# Patient Record
Sex: Male | Born: 1995 | Race: Black or African American | Hispanic: No | Marital: Single | Smoking: Current every day smoker
Health system: Southern US, Community
[De-identification: ages and names within clinical notes are randomized; demographics above are authoritative.]

## PROBLEM LIST (undated history)

## (undated) DIAGNOSIS — M419 Scoliosis, unspecified: Secondary | ICD-10-CM

## (undated) DIAGNOSIS — L709 Acne, unspecified: Secondary | ICD-10-CM

## (undated) DIAGNOSIS — K37 Unspecified appendicitis: Secondary | ICD-10-CM

## (undated) DIAGNOSIS — E669 Obesity, unspecified: Secondary | ICD-10-CM

## (undated) HISTORY — DX: Obesity, unspecified: E66.9

## (undated) HISTORY — DX: Scoliosis, unspecified: M41.9

## (undated) HISTORY — DX: Acne, unspecified: L70.9

## (undated) HISTORY — PX: APPENDECTOMY: SHX54

## (undated) HISTORY — DX: Unspecified appendicitis: K37

---
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
## 2016-09-17 ENCOUNTER — Ambulatory Visit (INDEPENDENT_AMBULATORY_CARE_PROVIDER_SITE_OTHER): Payer: Medicaid Other | Admitting: Family Medicine

## 2016-09-17 ENCOUNTER — Ambulatory Visit: Payer: Medicaid Other | Admitting: Family Medicine

## 2016-09-17 ENCOUNTER — Encounter: Payer: Self-pay | Admitting: Family Medicine

## 2016-09-17 VITALS — BP 120/80 | HR 75 | Temp 98.2°F | Resp 18 | Ht 74.0 in | Wt 166.6 lb

## 2016-09-17 DIAGNOSIS — Z113 Encounter for screening for infections with a predominantly sexual mode of transmission: Secondary | ICD-10-CM

## 2016-09-17 DIAGNOSIS — F1210 Cannabis abuse, uncomplicated: Secondary | ICD-10-CM | POA: Diagnosis not present

## 2016-09-17 DIAGNOSIS — Z0000 Encounter for general adult medical examination without abnormal findings: Secondary | ICD-10-CM | POA: Diagnosis not present

## 2016-09-17 DIAGNOSIS — Z00129 Encounter for routine child health examination without abnormal findings: Secondary | ICD-10-CM | POA: Diagnosis not present

## 2016-09-17 NOTE — Patient Instructions (Addendum)
Preventive Care 18-39 Years, Male Preventive care refers to lifestyle choices and visits with your health care provider that can promote health and wellness. What does preventive care include?  A yearly physical exam. This is also called an annual well check.  Dental exams once or twice a year.  Routine eye exams. Ask your health care provider how often you should have your eyes checked.  Personal lifestyle choices, including:  Daily care of your teeth and gums.  Regular physical activity.  Eating a healthy diet.  Avoiding tobacco and drug use.  Limiting alcohol use.  Practicing safe sex. What happens during an annual well check? The services and screenings done by your health care provider during your annual well check will depend on your age, overall health, lifestyle risk factors, and family history of disease. Counseling  Your health care provider may ask you questions about your:  Alcohol use.  Tobacco use.  Drug use.  Emotional well-being.  Home and relationship well-being.  Sexual activity.  Eating habits.  Work and work Statistician. Screening  You may have the following tests or measurements:  Height, weight, and BMI.  Blood pressure.  Lipid and cholesterol levels. These may be checked every 5 years starting at age 58.  Diabetes screening. This is done by checking your blood sugar (glucose) after you have not eaten for a while (fasting).  Skin check.  Hepatitis C blood test.  Hepatitis B blood test.  Sexually transmitted disease (STD) testing. Discuss your test results, treatment options, and if necessary, the need for more tests with your health care provider. Vaccines  Your health care provider may recommend certain vaccines, such as:  Influenza vaccine. This is recommended every year.  Tetanus, diphtheria, and acellular pertussis (Tdap, Td) vaccine. You may need a Td booster every 10 years.  Varicella vaccine. You may need this if you  have not been vaccinated.  HPV vaccine. If you are 60 or younger, you may need three doses over 6 months.  Measles, mumps, and rubella (MMR) vaccine. You may need at least one dose of MMR.You may also need a second dose.  Pneumococcal 13-valent conjugate (PCV13) vaccine. You may need this if you have certain conditions and have not been vaccinated.  Pneumococcal polysaccharide (PPSV23) vaccine. You may need one or two doses if you smoke cigarettes or if you have certain conditions.  Meningococcal vaccine. One dose is recommended if you are age 70-21 years and a first-year college student living in a residence hall, or if you have one of several medical conditions. You may also need additional booster doses.  Hepatitis A vaccine. You may need this if you have certain conditions or if you travel or work in places where you may be exposed to hepatitis A.  Hepatitis B vaccine. You may need this if you have certain conditions or if you travel or work in places where you may be exposed to hepatitis B.  Haemophilus influenzae type b (Hib) vaccine. You may need this if you have certain risk factors. Talk to your health care provider about which screenings and vaccines you need and how often you need them. This information is not intended to replace advice given to you by your health care provider. Make sure you discuss any questions you have with your health care provider. Document Released: 06/26/2001 Document Revised: 01/18/2016 Document Reviewed: 03/01/2015 Elsevier Interactive Patient Education  2017 [employer].

## 2016-09-17 NOTE — Progress Notes (Signed)
Routine Well-Adolescent Visit  PCP: Alba CorySowles, Krichna, MD   History was provided by the patient.  [name] [name] is a 21 y.o. male who is here for annual examination.  Pt has been attending [employer], but is considering transferring to [employer] to be closer to home - is getting associate's degree in arts and certificate for business, sales and administration.  Not currently employed, but is interviewing for jobs.  Living with mom and younger brother.  Adolescent Assessment:  Confidentiality was discussed with the patient and if applicable, with caregiver as well.  Home and Environment:  Lives with: lives at home with mom and younger brother Parental relations: Good Friends/Peers: Spends time with cousins - goes to gym, plays basketball, occasionally drinks alcohol, smokes marijuana daily - 3-4 times a day. Nutrition/Eating Behaviors: Eating a mostly balanced diet - eats out 5 times a weeks, drinks plenty of water, drinks soda very seldomly Sports/Exercise:  Basketball regularly, has [employer] gym membership and goes about twice a week. Sexually Active: Yes - .  Agreeable to GC/Chlam dip only - declines HIV and Syphilis screening today although this is strongly recommended to him.  Education and Employment:  School Status: in [employer] as above and is doing well - A's, B's, & C's School History: School attendance is regular. Work: As above Activities: [employer] - is in sneaker groups for [employer]  With parent out of the room and confidentiality discussed:   Patient reports being comfortable and safe at school and at home? Yes  Smoking: No cigarettes - marijuana use as above Secondhand smoke exposure? no Drugs/EtOH: Marijuana use 3-4 times daily. Drinks 1-2 drinks with cousins every month or so.   Sexuality:  - Sexually active? yes  - sexual partners in last year: 2 - contraception use: condoms - Last STI Screening: Never - will perform today  - Violence/Abuse: No concerns, no  family/friends with guns  Mood: Suicidality and Depression: Denies Weapons: Denies access  Screenings: The following topics were discussed as part of anticipatory guidance healthy eating, exercise, weapon use, marijuana use, condom use, birth control, screen time and school.  PHQ-2 completed and results indicated no depression present. [facility] screen [facility]/2016  Decreased Interest 0  Down, Depressed, Hopeless 0  PHQ - 2 Score 0     ROS: Constitutional: Negative for fever or weight change.  Respiratory: Negative for cough and shortness of breath.   Cardiovascular: Negative for chest pain or palpitations.  Gastrointestinal: Negative for abdominal pain, no bowel changes.  Musculoskeletal: Negative for gait problem or joint swelling.  Skin: Negative for rash.  Neurological: Negative for dizziness or headache.  No other specific complaints in a complete review of systems (except as listed in HPI above).  Physical Exam:  BP 120/80 (BP Location: Left Arm, Patient Position: Sitting, Cuff Size: Large)   Pulse 75   Temp 98.2 F (36.8 C) (Oral)   Resp 18   Ht 6\' 2"  (1.88 m)   Wt 166 lb 9.6 oz (75.6 kg)   SpO2 99%   BMI 21.39 kg/m  Blood pressure percentiles are 29.1 % systolic and 52.7 % diastolic based on NHBPEP's 4th Report.   Nursing notes and vital signs.  General Appearance:   alert, oriented, no acute distress and well nourished  HENT: Normocephalic, no obvious abnormality, PERRL, EOM's intact, conjunctiva clear  Mouth:   Normal appearing teeth, no obvious discoloration, dental caries, or dental caps  Neck:   Supple; thyroid: no enlargement, symmetric, no  tenderness/mass/nodules  Lungs:   Clear to auscultation bilaterally, normal work of breathing  Heart:   Regular rate and rhythm, S1 and S2 normal, no murmurs;   Abdomen:   Soft, non-tender, no mass, or organomegaly  GU Deferred  Musculoskeletal:   Tone and strength strong and symmetrical, all extremities                Lymphatic:   No cervical adenopathy  Skin/Hair/Nails:   Skin warm, dry and intact, no rashes, no bruises or petechiae  Neurologic:   Strength, gait, patellar reflexes, and coordination normal and age-appropriate    Assessment/Plan: 1. Well adolescent visit without abnormal findings BMI: is appropriate for age Immunizations today: per orders. History of previous adverse reactions to immunizations? no Orders Placed This Encounter  Procedures  . GC/Chlamydia Probe Amp  - Follow-up visit in 1 year for next visit, or sooner as needed.   2. Routine screening for STI (sexually transmitted infection) - GC/Chlamydia Probe Amp - Pt declines HIV or Syphilis Testing today though it is strongly encouraged today.  3. Marijuana abuse -Discussed at length, pt is not willing to decrease use at this time.  Doren Custard, FNP   I have reviewed this encounter including the documentation in this note and/or discussed this patient with the Deboraha Sprang, FNP, NP-C. I also saw patient with Maurice Small  Alba Cory, MD [facility] 09/17/2016, 12:03 PM

## 2016-09-18 LAB — GC/CHLAMYDIA PROBE AMP
CT Probe RNA: DETECTED — AB
GC Probe RNA: NOT DETECTED

## 2016-09-18 NOTE — Progress Notes (Signed)
Please call the patient and advise him that he needs to schedule an appointment as soon as possible (tomorrow 09/19/16 would be best).  His Chlamydia screening was positive, and we need to provide treatment and counseling in the office.  Thank you!

## 2016-09-19 ENCOUNTER — Ambulatory Visit (INDEPENDENT_AMBULATORY_CARE_PROVIDER_SITE_OTHER): Payer: Self-pay | Admitting: Family Medicine

## 2016-09-19 ENCOUNTER — Encounter: Payer: Self-pay | Admitting: Family Medicine

## 2016-09-19 VITALS — BP 122/70 | HR 69 | Temp 98.1°F | Resp 20 | Ht 74.0 in | Wt 166.6 lb

## 2016-09-19 DIAGNOSIS — A562 Chlamydial infection of genitourinary tract, unspecified: Secondary | ICD-10-CM

## 2016-09-19 DIAGNOSIS — Z708 Other sex counseling: Secondary | ICD-10-CM

## 2016-09-19 DIAGNOSIS — Z113 Encounter for screening for infections with a predominantly sexual mode of transmission: Secondary | ICD-10-CM

## 2016-09-19 MED ORDER — AZITHROMYCIN 500 MG PO TABS: 1000.0000 mg | ORAL_TABLET | Freq: Once | ORAL | Status: DC

## 2016-09-19 NOTE — Patient Instructions (Addendum)
Please practice abstinence for at least 7 days after taking the medication today.

## 2016-09-19 NOTE — Progress Notes (Addendum)
Name: Jonathan Peterson.   MRN: [id number]    DOB: 31-Dec-1995   Date:09/19/2016       Progress Note  Subjective  Chief Complaint  Chief Complaint  Patient presents with  . Exposure to STD    HPI  Pt presents to follow up on positive Chlamydia Testing. He has not had any symptoms, and this was found on a routine screening exam. He is comfortable receiving HIV/RPR testing today as well.  Pt is tearful in office during conversation, is receptive to counseling and instructions.  Patient Active Problem List   Diagnosis Date Noted  . Midline thoracic back pain 07/05/2014  . Spinal asymmetry (< 10 degrees) 07/05/2014    Social History  Substance Use Topics  . Smoking status: Never Smoker  . Smokeless tobacco: Never Used  . Alcohol use Yes     Comment: occasional    No current outpatient prescriptions on file.  Current Facility-Administered Medications:  .  azithromycin (ZITHROMAX) tablet 1,000 mg, 1,000 mg, Oral, Once, Doren Custard, FNP  Allergies  Allergen Reactions  . Peanut Oil Hives    ROS Constitutional: Negative for fever or weight change.  Respiratory: Negative for cough and shortness of breath.   Cardiovascular: Negative for chest pain or palpitations.  Gastrointestinal: Negative for abdominal pain, no bowel changes.  GU: No discharge, bleeding, itchiness, lesions, or dysuria. Musculoskeletal: Negative for gait problem or joint swelling.  Skin: Negative for rash.  Neurological: Negative for dizziness or headache.  No other specific complaints in a complete review of systems (except as listed in HPI above).  Objective  Vitals:   09/19/16 1004 09/19/16 1105  BP: 122/70   Pulse: (!) 102 69  Resp: 20   Temp: 98.1 F (36.7 C)   TempSrc: Oral   SpO2: 96%   Weight: 166 lb 9.6 oz (75.6 kg)   Height: 6\' 2"  (1.88 m)     Body mass index is 21.39 kg/m.  Nursing Note and Vital Signs reviewed. Heart rate slightly elevated due to emotional state/stress at  beginning of visit.  Physical Exam  Constitutional: Patient appears well-developed and well-nourished.  Cardiovascular: Normal rate, regular rhythm, S1/S2 present.  No murmur or rub heard. No BLE edema. Pulmonary/Chest: Effort normal and breath sounds clear. No respiratory distress or retractions. Abdominal: Soft and non-tender, bowel sounds present x4 quadrants. GU: Deferred. Psychiatric: Patient has a normal mood and affect. behavior is normal. Judgment and thought content normal.  Recent Results (from the past 2160 hour(s))  GC/Chlamydia Probe Amp     Status: Abnormal   Collection Time: 09/17/16 10:48 AM  Result Value Ref Range   CT Probe RNA DETECTED (A)     Comment:   A Positive CT or NG Nucleic Acid Amplification Test (NAAT) result should be considered presumptive evidence of infection. The result should be evaluated along with physical examination and other diagnostic findings.                    **Normal Reference Range: NOT DETECTED**   This test was performed using the APTIMA COMBO2 Assay ([employer]   The analytical performance characteristics of this assay, when used to test SurePath specimens have been determined by [facility]      GC Probe RNA NOT DETECTED     Comment:                    **Normal Reference Range: NOT DETECTED**   This test was  performed using the APTIMA COMBO2 Assay ([employer]   The analytical performance characteristics of this assay, when used to test SurePath specimens have been determined by [facility]        Assessment & Plan  1. Chlamydia trachomatis infection of genitourinary site  - azithromycin (ZITHROMAX) tablet 1,000 mg; Take 2 tablets (1,000 mg total) by mouth once. Witnessed therapy in office during visit. - Counseling on abstinence for >7 days provided. - Report sent to [facility], patient aware of this reporting and is counseled on what to expect after Health Dept contacts him. - Reassurance  and counseling provided at length with patient - Plan to follow up in 3 months for re-infection testing.  2. Sexually transmitted disease counseling  - RPR - HIV antibody - Counseling regarding diagnosis and treatment, what to expect from [facility] Department, STI prevention including consistent condom use discussed at length with teach back method used  3. Encounter for screening examination for sexually transmitted disease  - RPR - HIV antibody  -Red flags and when to present for emergency care or RTC including fever >101.84F, new/worsening/un-resolving symptoms, reviewed with patient at time of visit. Follow up and care instructions discussed and provided in AVS  I have reviewed this encounter including the documentation in this note and/or discussed this patient with the Deboraha Sprangprovider,Jamieson Hetland, FNP, NP-C. I am certifying that I agree with the content of this note as supervising physician.  Alba CoryKrichna Sowles, MD [facility] 09/19/2016, 2:55 PM

## 2016-09-20 LAB — RPR

## 2016-09-20 LAB — HIV ANTIBODY (ROUTINE TESTING W REFLEX): HIV: NONREACTIVE

## 2016-12-05 ENCOUNTER — Emergency Department
Admission: EM | Admit: 2016-12-05 | Discharge: 2016-12-05 | Disposition: A | Discharge status: Home / Self Care | Payer: Self-pay | Attending: Emergency Medicine | Admitting: Emergency Medicine

## 2016-12-05 ENCOUNTER — Emergency Department: Payer: Self-pay

## 2016-12-05 DIAGNOSIS — Z91010 Allergy to peanuts: Secondary | ICD-10-CM | POA: Insufficient documentation

## 2016-12-05 DIAGNOSIS — M41129 Adolescent idiopathic scoliosis, site unspecified: Secondary | ICD-10-CM | POA: Insufficient documentation

## 2016-12-05 DIAGNOSIS — R109 Unspecified abdominal pain: Secondary | ICD-10-CM | POA: Insufficient documentation

## 2016-12-05 DIAGNOSIS — R112 Nausea with vomiting, unspecified: Secondary | ICD-10-CM | POA: Insufficient documentation

## 2016-12-05 LAB — LIPASE, BLOOD: LIPASE: 17 U/L (ref 11–51)

## 2016-12-05 LAB — CBC
HEMATOCRIT: 44.6 % (ref 40.0–52.0)
HEMOGLOBIN: 15.2 g/dL (ref 13.0–18.0)
MCH: 28.9 pg (ref 26.0–34.0)
MCHC: 34 g/dL (ref 32.0–36.0)
MCV: 84.9 fL (ref 80.0–100.0)
Platelets: 257 10*3/uL (ref 150–440)
RBC: 5.25 MIL/uL (ref 4.40–5.90)
RDW: 14.4 % (ref 11.5–14.5)
WBC: 7.3 10*3/uL (ref 3.8–10.6)

## 2016-12-05 LAB — COMPREHENSIVE METABOLIC PANEL
ALBUMIN: 5 g/dL (ref 3.5–5.0)
ALK PHOS: 62 U/L (ref 38–126)
ALT: 11 U/L — ABNORMAL LOW (ref 17–63)
ANION GAP: 11 (ref 5–15)
AST: 23 U/L (ref 15–41)
BUN: 13 mg/dL (ref 6–20)
CHLORIDE: 102 mmol/L (ref 101–111)
CO2: 26 mmol/L (ref 22–32)
Calcium: 9.6 mg/dL (ref 8.9–10.3)
Creatinine, Ser: 1.1 mg/dL (ref 0.61–1.24)
GFR calc Af Amer: >60 mL/min (ref >60)
GFR calc non Af Amer: >60 mL/min (ref >60)
GLUCOSE: 105 mg/dL — AB (ref 65–99)
POTASSIUM: 3.6 mmol/L (ref 3.5–5.1)
SODIUM: 139 mmol/L (ref 135–145)
Total Bilirubin: 1 mg/dL (ref 0.3–1.2)
Total Protein: 8.1 g/dL (ref 6.5–8.1)

## 2016-12-05 MED ORDER — ONDANSETRON HCL 4 MG PO TABS: 4.0000 mg | ORAL_TABLET | Freq: Every day | ORAL | 0 refills | Status: DC | PRN

## 2016-12-05 MED ORDER — ONDANSETRON HCL 4 MG/2ML IJ SOLN
4.0000 mg | Freq: Once | INTRAMUSCULAR | Status: AC
  Administered 2016-12-05: 4 mg via INTRAVENOUS
  Filled 2016-12-05: qty 2

## 2016-12-05 MED ORDER — SODIUM CHLORIDE 0.9 % IV BOLUS (SEPSIS)
1000.0000 mL | Freq: Once | INTRAVENOUS | Status: AC
  Administered 2016-12-05: 1000 mL via INTRAVENOUS

## 2016-12-05 MED ORDER — IOPAMIDOL (ISOVUE-300) INJECTION 61%
100.0000 mL | Freq: Once | INTRAVENOUS | Status: AC | PRN
  Administered 2016-12-05: 100 mL via INTRAVENOUS

## 2016-12-05 MED ORDER — IBUPROFEN 600 MG PO TABS
ORAL_TABLET | ORAL | Status: DC
Start: 2016-12-05 — End: 2016-12-05
  Filled 2016-12-05: qty 1

## 2016-12-05 MED ORDER — MORPHINE SULFATE (PF) 4 MG/ML IV SOLN
4.0000 mg | Freq: Once | INTRAVENOUS | Status: AC
  Administered 2016-12-05: 4 mg via INTRAVENOUS
  Filled 2016-12-05: qty 1

## 2016-12-05 MED ORDER — IBUPROFEN 600 MG PO TABS
600.0000 mg | ORAL_TABLET | Freq: Once | ORAL | Status: AC
  Administered 2016-12-05: 600 mg via ORAL

## 2016-12-05 MED ORDER — IOPAMIDOL (ISOVUE-300) INJECTION 61%
30.0000 mL | Freq: Once | INTRAVENOUS | Status: AC | PRN
  Administered 2016-12-05: 30 mL via ORAL

## 2016-12-05 NOTE — ED Triage Notes (Signed)
Pt c/o lower abd pain with N/V/ since last night..Marland Kitchen

## 2016-12-05 NOTE — Discharge Instructions (Signed)
If you have increased pain, fever, vomiting or you feel worse in any way please return to the emergency department.

## 2016-12-05 NOTE — ED Notes (Signed)
Pt given ginger ale to assess PO tolerance.

## 2016-12-05 NOTE — ED Provider Notes (Addendum)
[facility] Emergency Department Provider Note  ____________________________________________   I have reviewed the triage vital signs and the nursing notes.   HISTORY  Chief Complaint Abdominal Pain    HPI Jonathan D Ezzard StandingLove Jr. is a 21 y.o. male who is healthy never had surgery complains of lower abdominal discomfort more on the right than the left since last night with nausea and vomiting. Had what appeared to be a normal bowel movement to him. Denies any fever. No hematemesis.He is sharp, gradual onset, tried Pepto-Bismol with no relief, nothing makes it better, nothing makes it worse.     Past Medical History:  Diagnosis Date  . Acne   . Obesity   . Scoliosis     Patient Active Problem List   Diagnosis Date Noted  . Midline thoracic back pain 07/05/2014  . Spinal asymmetry (< 10 degrees) 07/05/2014    History reviewed. No pertinent surgical history.  Prior to Admission medications   Not on File    Allergies Peanut oil  Family History  Problem Relation Age of Onset  . Allergic rhinitis Sister     Social History Social History  Substance Use Topics  . Smoking status: Never Smoker  . Smokeless tobacco: Never Used  . Alcohol use Yes     Comment: occasional    Review of Systems Constitutional: No fever/chills Eyes: No visual changes. ENT: No sore throat. No stiff neck no neck pain Cardiovascular: Denies chest pain. Respiratory: Denies shortness of breath. Gastrointestinal:   Positive vomiting.  No diarrhea.  No constipation. Genitourinary: Negative for dysuria. Musculoskeletal: Negative lower extremity swelling Skin: Negative for rash. Neurological: Negative for severe headaches, focal weakness or numbness.   ____________________________________________   PHYSICAL EXAM:  VITAL SIGNS: ED Triage Vitals  Enc Vitals Group     BP 12/05/16 0730 (!) 159/98     Pulse Rate 12/05/16 0728 68     Resp 12/05/16 0728 17     Temp  12/05/16 0731 98.3 F (36.8 C)     Temp Source 12/05/16 0728 Oral     SpO2 12/05/16 0728 99 %     Weight 12/05/16 0729 165 lb (74.8 kg)     Height 12/05/16 0729 6\' 2"  (1.88 m)     Head Circumference --      Peak Flow --      Pain Score 12/05/16 0728 8     Pain Loc --      Pain Edu? --      Excl. in GC? --     Constitutional: Alert and oriented. Well appearing and in no acute distress.Talking on his cell phone Eyes: Conjunctivae are normal Head: Atraumatic HEENT: No congestion/rhinnorhea. Mucous membranes are moist.  Oropharynx non-erythematous Neck:   Nontender with no meningismus, no masses, no stridor Cardiovascular: Normal rate, regular rhythm. Grossly normal heart sounds.  Good peripheral circulation. Respiratory: Normal respiratory effort.  No retractions. Lungs CTAB. Abdominal: Soft and Positive tenderness to palpation specifically in the right lower quadrant with voluntary guarding no rebound. No distention.  GU: No testicular pain or swelling Back:  There is no focal tenderness or step off.  there is no midline tenderness there are no lesions noted. there is no CVA tenderness Musculoskeletal: No lower extremity tenderness, no upper extremity tenderness. No joint effusions, no DVT signs strong distal pulses no edema Neurologic:  Normal speech and language. No gross focal neurologic deficits are appreciated.  Skin:  Skin is warm, dry and intact. No rash noted.  Psychiatric: Mood and affect are normal. Speech and behavior are normal.  ____________________________________________   LABS (all labs ordered are listed, but only abnormal results are displayed)  Labs Reviewed  COMPREHENSIVE METABOLIC PANEL - Abnormal; Notable for the following:       Result Value   Glucose, Bld 105 (*)    ALT 11 (*)    All other components within normal limits  LIPASE, BLOOD  CBC  URINALYSIS, COMPLETE (UACMP) WITH MICROSCOPIC   ____________________________________________  EKG  I  personally interpreted any EKGs ordered by me or triage  ____________________________________________  RADIOLOGY  I reviewed any imaging ordered by me or triage that were performed during my shift and, if possible, patient and/or family made aware of any abnormal findings. ____________________________________________   PROCEDURES  Procedure(s) performed: None  Procedures  Critical Care performed: None  ____________________________________________   INITIAL IMPRESSION / ASSESSMENT AND PLAN / ED COURSE  Pertinent labs & imaging results that were available during my care of the patient were reviewed by me and considered in my medical decision making (see chart for details).  Patient here with right lower quadrant abdominal pain concern for possible appendicitis but given the fact that his white count was normal he has no fever we'll obtain CT prior to consultation surgery.  ----------------------------------------- 11:14 AM on 12/05/2016 -----------------------------------------  No fever no white count, no evidence on CT a definitive appendicitis. We will attempt by mouth challenge. On repeat exam patient is pain-free with no tenderness to deep palpation. ----------------------------------------- 12:29 PM on 12/05/2016 -----------------------------------------  Although it is is possible that he has a very early undiagnosable appendicitis, have low suspicion of that. Nonetheless that given extensive return precautions if he has any increased pain or if symptoms. He is aware of all the findings on CT. Again close outpatient follow-up, he is tolerating by mouth with no difficulty.    ____________________________________________   FINAL CLINICAL IMPRESSION(S) / ED DIAGNOSES  Final diagnoses:  None      This chart was dictated using voice recognition software.  Despite best efforts to proofread,  errors can occur which can change meaning.      Jeanmarie PlantMcShane, Breslyn Abdo A,  MD 12/05/16 [id number]    Jeanmarie PlantMcShane, Legion Discher A, MD 12/05/16 1153    Jeanmarie PlantMcShane, Urbano Milhouse A, MD 12/05/16 1229

## 2016-12-05 NOTE — ED Notes (Signed)
Pt tolerated ginger ale and water without difficulty. Pt requested something for abdominal cramp pain. Dr. Alphonzo LemmingsMcShane notified.

## 2016-12-20 ENCOUNTER — Encounter: Payer: Self-pay | Admitting: Family Medicine

## 2016-12-20 ENCOUNTER — Ambulatory Visit (INDEPENDENT_AMBULATORY_CARE_PROVIDER_SITE_OTHER): Payer: Medicaid Other | Admitting: Family Medicine

## 2016-12-20 VITALS — BP 122/84 | HR 79 | Temp 98.4°F | Resp 20 | Ht 74.0 in | Wt 167.2 lb

## 2016-12-20 DIAGNOSIS — Z8619 Personal history of other infectious and parasitic diseases: Secondary | ICD-10-CM | POA: Diagnosis not present

## 2016-12-20 NOTE — Patient Instructions (Addendum)
Preventing Sexually Transmitted Infections, Adult Sexually transmitted infections (STIs) are diseases that are passed (transmitted) from person to person through bodily fluids exchanged during sex or sexual contact. Bodily fluids include saliva, semen, blood, vaginal mucus, and urine. You may have an increased risk for developing an STI if you have unprotected oral, vaginal, or anal sex. Some common STIs include:  Herpes.  Hepatitis B.  Chlamydia.  Gonorrhea.  Syphilis.  HPV (human papillomavirus).  HIV (humanimmunodeficiency virus), the virus that can cause AIDS (acquired immunodeficiency virus).  How can I protect myself from sexually transmitted infections? The only way to completely prevent STIs is not to have sex of any kind (practice abstinence). This includes oral, vaginal, or anal sex. If you are sexually active, take these actions to lower your risk of getting an STI:  Have only one sex partner (be monogamous) or limit the number of sexual partners you have.  Stay up-to-date on immunizations. Certain vaccines can lower your risk of getting certain STIs, such as: ? Hepatitis A and B vaccines. You may have been vaccinated as a young child, but likely need a booster shot as a teen or young adult. ? HPV vaccine. This vaccine is recommended if you are a man under age 22 or a woman under age 27.  Use methods that prevent the exchange of body fluids between partners (barrier protection) every time you have sex. Barrier protection can be used during oral, vaginal, or anal sex. Commonly used barrier methods include: ? Male condom. ? Male condom. ? Dental dam.  Get tested regularly for STIs. Have your sexual partner get tested regularly as well.  Avoid mixing alcohol, drugs, and sex. Alcohol and drug use can affect your ability to make good decisions and can lead to risky sexual behaviors.  Ask your health care provider about taking pre-exposure prophylaxis (PrEP) to prevent HIV  infection if you: ? Have a HIV-positive sexual partner. ? Have multiple sexual partners or partners who do not know their HIV status, and do not regularly use a condom during sex. ? Use injection drugs and share needles.  Birth control pills, injections, implants, and intrauterine devices (IUDs) do not protect against STIs. To prevent both STIs and pregnancy, always use a condom with another form of birth control. Some STIs, such as herpes, are spread through skin to skin contact. A condom does not protect you from getting such STIs. If you or your partner have herpes and there is an active flare with open sores, avoid all sexual contact. Why are these changes important? Taking steps to practice safe sex protects you and others. Many STIs can be cured. However, some STIs are not curable and will affect you for the rest of your life. STIs can be passed on to another person even if you do not have symptoms. What can happen if changes are not made? Certain STIs may:  Require you to take medicine for the rest of your life.  Affect your ability to have children (your fertility).  Increase your risk for developing another STI or certain serious health conditions, such as: ? Cervical cancer. ? Head and neck cancer. ? Pelvic inflammatory disease (PID) in women. ? Organ damage or damage to other parts of your body, if the infection spreads.  Be passed to a baby during childbirth.  How are sexually transmitted infections treated? If you or your partner know or think that you may have an STI:  Talk with your healthcare provider about what can be   done to treat it. Some STIs can be treated and cured with medicines.  For curable STIs, you and your partner should avoid sex during treatment and for several days after treatment is complete.  You and your partner should both be treated at the same time, if there is any chance that your partner is infected as well. If you get treatment but your partner  does not, your partner can re-infect you when you resume sexual contact.  Do not have unprotected sex.  Where to find more information: Learn more about sexually transmitted diseases and infections from:  Centers for Disease Control and Prevention: ? More information about specific STIs: www.cdc.gov/std ? Find places to get sexual health counseling and treatment for free or for a low cost: gettested.cdc.gov  [place] Department of Health and Human Services: www.womenshealth.gov/publications/our-publications/fact-sheet/sexually-transmitted-infections.html  Summary  The only way to completely prevent STIs is not to have sex (practice abstinence), including oral, vaginal, or anal sex.  STIs can spread through saliva, semen, blood, vaginal mucus, urine, or sexual contact.  If you do have sex, limit your number of sexual partners and use a barrier protection method every time you have sex.  If you develop an STI, get treated right away and ask your partner to be treated as well. Do not resume having sex until both of you have completed treatment for the STI. This information is not intended to replace advice given to you by your health care provider. Make sure you discuss any questions you have with your health care provider. Document Released: 04/26/2016 Document Revised: 04/26/2016 Document Reviewed: 04/26/2016 Elsevier Interactive Patient Education  2018 Elsevier Inc.  

## 2016-12-20 NOTE — Progress Notes (Addendum)
Name: Jonathan Peterson.   MRN: [id number]    DOB: Feb 05, 1996   Date:12/20/2016       Progress Note  Subjective  Chief Complaint  Chief Complaint  Patient presents with  . Follow-up    STD    HPI  Pt presents to follow up on positive chlamydia treatment on 09/19/2016.  We will check urine today to ensure patient has been cured from chlamydial infection.  No new partners, no dysuria, penile discharge or blood, no lesions in genital region, no abdominal pain.  Patient Active Problem List   Diagnosis Date Noted  . Midline thoracic back pain 07/05/2014  . Spinal asymmetry (< 10 degrees) 07/05/2014    Social History  Substance Use Topics  . Smoking status: Never Smoker  . Smokeless tobacco: Never Used  . Alcohol use Yes     Comment: occasional    Current Outpatient Prescriptions:  .  ondansetron (ZOFRAN) 4 MG tablet, Take 1 tablet (4 mg total) by mouth daily as needed for nausea or vomiting. (Patient not taking: Reported on 12/20/2016), Disp: 10 tablet, Rfl: 0  Current Facility-Administered Medications:  .  azithromycin (ZITHROMAX) tablet 1,000 mg, 1,000 mg, Oral, Once, Hubbard Hartshorn, FNP  Allergies  Allergen Reactions  . Peanut Oil Hives    ROS  Constitutional: Negative for fever or weight change.  Respiratory: Negative for cough and shortness of breath.   Cardiovascular: Negative for chest pain or palpitations.  Gastrointestinal: Negative for abdominal pain, no bowel changes.  Musculoskeletal: Negative for gait problem or joint swelling.  Skin: Negative for rash.  Neurological: Negative for dizziness or headache.  No other specific complaints in a complete review of systems (except as listed in HPI above).  Objective  Vitals:   12/20/16 1005  BP: 122/84  Pulse: 79  Resp: 20  Temp: 98.4 F (36.9 C)  TempSrc: Oral  SpO2: 98%  Weight: 167 lb 3.2 oz (75.8 kg)  Height: 6' 2"  (1.88 m)   Body mass index is 21.47 kg/m.  Nursing Note and Vital Signs  reviewed.  Physical Exam  Constitutional: Patient appears well-developed and well-nourished.  No distress.  HEENT: head atraumatic, normocephalic Cardiovascular: Normal rate, regular rhythm, S1/S2 present.  No murmur or rub heard. No BLE edema. Pulmonary/Chest: Effort normal and breath sounds clear. No respiratory distress or retractions. Abdominal: Soft and non-tender Psychiatric: Patient has a normal mood and affect. behavior is normal. Judgment and thought content normal. MALE GENITALIA: Deferred.  Recent Results (from the past 2160 hour(s))  Lipase, blood     Status: None   Collection Time: 12/05/16  7:44 AM  Result Value Ref Range   Lipase 17 11 - 51 U/L  Comprehensive metabolic panel     Status: Abnormal   Collection Time: 12/05/16  7:44 AM  Result Value Ref Range   Sodium 139 135 - 145 mmol/L   Potassium 3.6 3.5 - 5.1 mmol/L   Chloride 102 101 - 111 mmol/L   CO2 26 22 - 32 mmol/L   Glucose, Bld 105 (H) 65 - 99 mg/dL   BUN 13 6 - 20 mg/dL   Creatinine, Ser 1.10 0.61 - 1.24 mg/dL   Calcium 9.6 8.9 - 10.3 mg/dL   Total Protein 8.1 6.5 - 8.1 g/dL   Albumin 5.0 3.5 - 5.0 g/dL   AST 23 15 - 41 U/L   ALT 11 (L) 17 - 63 U/L   Alkaline Phosphatase 62 38 - 126 U/L   Total Bilirubin  1.0 0.3 - 1.2 mg/dL   GFR calc non Af Amer >60 >60 mL/min   GFR calc Af Amer >60 >60 mL/min    Comment: (NOTE) The eGFR has been calculated using the CKD EPI equation. This calculation has not been validated in all clinical situations. eGFR's persistently <60 mL/min signify possible Chronic Kidney Disease.    Anion gap 11 5 - 15  CBC     Status: None   Collection Time: 12/05/16  7:44 AM  Result Value Ref Range   WBC 7.3 3.8 - 10.6 K/uL   RBC 5.25 4.40 - 5.90 MIL/uL   Hemoglobin 15.2 13.0 - 18.0 g/dL   HCT 44.6 40.0 - 52.0 %   MCV 84.9 80.0 - 100.0 fL   MCH 28.9 26.0 - 34.0 pg   MCHC 34.0 32.0 - 36.0 g/dL   RDW 14.4 11.5 - 14.5 %   Platelets 257 150 - 440 K/uL     Assessment &  Plan  1. History of chlamydia infection - GC/Chlamydia Probe Amp  I have reviewed this encounter including the documentation in this note and/or discussed this patient with the Johney Maine, FNP, NP-C. I am certifying that I agree with the content of this note as supervising physician.  Steele Sizer, MD [facility] 12/24/2016, 9:46 PM

## 2016-12-21 LAB — GC/CHLAMYDIA PROBE AMP
CT Probe RNA: DETECTED — AB
GC PROBE AMP APTIMA: NOT DETECTED

## 2016-12-25 ENCOUNTER — Encounter: Payer: Self-pay | Admitting: Family Medicine

## 2016-12-25 ENCOUNTER — Ambulatory Visit (INDEPENDENT_AMBULATORY_CARE_PROVIDER_SITE_OTHER): Payer: Medicaid Other | Admitting: Family Medicine

## 2016-12-25 VITALS — BP 124/70 | HR 92 | Temp 98.1°F | Resp 16 | Ht 74.0 in | Wt 162.4 lb

## 2016-12-25 DIAGNOSIS — A749 Chlamydial infection, unspecified: Secondary | ICD-10-CM

## 2016-12-25 MED ORDER — AZITHROMYCIN 500 MG PO TABS: 1000.0000 mg | ORAL_TABLET | Freq: Once | ORAL | Status: DC

## 2016-12-25 NOTE — Progress Notes (Addendum)
Name: Jonathan Peterson.   MRN: [id number]    DOB: Jun 01, 1995   Date:12/25/2016       Progress Note  Subjective  Chief Complaint  Chief Complaint  Patient presents with  . Exposure to STD    HPI  Pt presents to follow up on positive Chlamydia testing. He has two male sexual partners - one had been tested recently and was negative for Chlamydia, and the other he is unsure of her STI status.  He did contact both women yesterday to advise them that he was positive. We will complete health department paperwork today as well.  We discussed risks involved in having repetitive chlamydia, that it often increases the risk of contracted other STI's including gonorrhea.    Patient Active Problem List   Diagnosis Date Noted  . Midline thoracic back pain 07/05/2014  . Spinal asymmetry (< 10 degrees) 07/05/2014    Social History  Substance Use Topics  . Smoking status: Never Smoker  . Smokeless tobacco: Never Used  . Alcohol use Yes     Comment: occasional     Current Outpatient Prescriptions:  .  ondansetron (ZOFRAN) 4 MG tablet, Take 1 tablet (4 mg total) by mouth daily as needed for nausea or vomiting. (Patient not taking: Reported on 12/20/2016), Disp: 10 tablet, Rfl: 0  Allergies  Allergen Reactions  . Peanut Oil Hives    ROS  Constitutional: Negative for fever or weight change.  Respiratory: Negative for cough and shortness of breath.   Cardiovascular: Negative for chest pain or palpitations.  Gastrointestinal: Negative for abdominal pain, no bowel changes.  GU: No dysuria, no discharge. Musculoskeletal: Negative for gait problem or joint swelling.  Skin: Negative for rash.  Neurological: Negative for dizziness or headache.  No other specific complaints in a complete review of systems (except as listed in HPI above).  Objective  Vitals:   12/25/16 1004  BP: 124/70  Pulse: 92  Resp: 16  Temp: 98.1 F (36.7 C)  TempSrc: Oral  SpO2: 97%  Weight: 162 lb 6.4 oz (73.7  kg)  Height: _0  (1.88 m)   Body mass index is 20.85 kg/m.  Nursing Note and Vital Signs reviewed.  Physical Exam  Constitutional: Patient appears well-developed and well-nourished. No distress.  HEENT: head atraumatic, normocephalic Cardiovascular: Normal rate, regular rhythm, S1/S2 present.  No murmur or rub heard. No BLE edema. Pulmonary/Chest: Effort normal and breath sounds clear. No respiratory distress or retractions. Abdominal: Soft and non-tender, bowel sounds present x4 quadrants. Psychiatric: Patient has a normal mood and affect. behavior is normal. Judgment and thought content normal.  Recent Results (from the past 2160 hour(s))  Lipase, blood     Status: None   Collection Time: 12/05/16  7:44 AM  Result Value Ref Range   Lipase 17 11 - 51 U/L  Comprehensive metabolic panel     Status: Abnormal   Collection Time: 12/05/16  7:44 AM  Result Value Ref Range   Sodium 139 135 - 145 mmol/L   Potassium 3.6 3.5 - 5.1 mmol/L   Chloride 102 101 - 111 mmol/L   CO2 26 22 - 32 mmol/L   Glucose, Bld 105 (H) 65 - 99 mg/dL   BUN 13 6 - 20 mg/dL   Creatinine, Ser 1.10 0.61 - 1.24 mg/dL   Calcium 9.6 8.9 - 10.3 mg/dL   Total Protein 8.1 6.5 - 8.1 g/dL   Albumin 5.0 3.5 - 5.0 g/dL   AST 23 15 - 41  U/L   ALT 11 (L) 17 - 63 U/L   Alkaline Phosphatase 62 38 - 126 U/L   Total Bilirubin 1.0 0.3 - 1.2 mg/dL   GFR calc non Af Amer >60 >60 mL/min   GFR calc Af Amer >60 >60 mL/min    Comment: (NOTE) The eGFR has been calculated using the CKD EPI equation. This calculation has not been validated in all clinical situations. eGFR's persistently <60 mL/min signify possible Chronic Kidney Disease.    Anion gap 11 5 - 15  CBC     Status: None   Collection Time: 12/05/16  7:44 AM  Result Value Ref Range   WBC 7.3 3.8 - 10.6 K/uL   RBC 5.25 4.40 - 5.90 MIL/uL   Hemoglobin 15.2 13.0 - 18.0 g/dL   HCT 44.6 40.0 - 52.0 %   MCV 84.9 80.0 - 100.0 fL   MCH 28.9 26.0 - 34.0 pg   MCHC 34.0  32.0 - 36.0 g/dL   RDW 14.4 11.5 - 14.5 %   Platelets 257 150 - 440 K/uL  GC/Chlamydia Probe Amp     Status: Abnormal   Collection Time: 12/20/16 10:13 AM  Result Value Ref Range   CT Probe RNA DETECTED (A)     Comment:   A Positive CT or NG Nucleic Acid Amplification Test (NAAT) result should be considered presumptive evidence of infection. The result should be evaluated along with physical examination and other diagnostic findings.                    **Normal Reference Range: NOT DETECTED**   This test was performed using the APTIMA COMBO2 Assay ([place].).   The analytical performance characteristics of this assay, when used to test SurePath specimens have been determined by [facility]      GC Probe RNA NOT DETECTED     Comment:                    **Normal Reference Range: NOT DETECTED**   This test was performed using the APTIMA COMBO2 Assay ([place].).   The analytical performance characteristics of this assay, when used to test [facility]Path specimens have been determined by [place]  1. Chlamydia infection - azithromycin (ZITHROMAX) tablet 1,000 mg; Take 2 tablets (1,000 mg total) by mouth once. - Advised to completely abstain from sexual activity for 2 months, we will re-check urine in 2 months at a nurse visit. -Red flags and when to present for emergency care or RTC reviewed with patient at time of visit. Follow up and care instructions discussed and provided in AVS.  I have reviewed this encounter including the documentation in this note and/or discussed this patient with the Johney Maine, FNP, NP-C. I am certifying that I agree with the content of this note as supervising physician.  Steele Sizer, MD [facility] 12/25/2016, 5:08 PM

## 2016-12-25 NOTE — Patient Instructions (Addendum)
Chlamydia, Male Chlamydia is an STD (sexually transmitted disease). It is a bacterial infection that spreads through sexual contact (is contagious). Chlamydia can occur in different areas of the body, including the tube that moves urine from the bladder out of the body (urethra), the throat, or the rectum. This condition is not difficult to treat. However, if left untreated, chlamydia can lead to more serious health problems. What are the causes? Chlamydia is caused by the bacteria Chlamydia trachomatis. It is passed from an infected partner during sexual activity. Chlamydia can spread through contact with the genitals, mouth, or rectum. What are the signs or symptoms? In some cases, there may not be any symptoms for this condition (asymptomatic), especially early in the infection. If symptoms develop, they may include:  Burning when urinating.  Urinating frequently.  Pain or swelling in the testicles.  Watery, mucus-like discharge from the penis.  Redness, soreness, and swelling (inflammation) of the rectum.  Bleeding or discharge from the rectum.  Abdominal pain.  Itching, burning, or redness in the eyes, or discharge from the eyes.  How is this diagnosed? This condition may be diagnosed based on:  Urine tests.  Swab tests. Depending on your symptoms, your health care provider may use a cotton swab to collect discharge from your urethra or rectum to test for the bacteria.  How is this treated? This condition is treated with antibiotic medicines. Follow these instructions at home: Medicines  Take over-the-counter and prescription medicines only as told by your health care provider.  Take your antibiotic medicine as told by your health care provider. Do not stop taking the antibiotic even if you start to feel better. Sexual activity  Tell sexual partners about your infection. This includes any oral, anal, or vaginal sex partners you have had within 60 days of when your  symptoms started. Sexual partners should also be treated, even if they have no signs of the disease.  Do not have sex until you and your sexual partners have completed treatment and your health care provider says it is okay. If your health care provider prescribed you a single dose treatment, wait 7 days after taking the treatment before having sex. General instructions  It is your responsibility to get your test results. Ask your health care provider, or the department performing the test, when your results will be ready.  Get plenty of rest.  Eat a healthy, well-balanced diet.  Drink enough fluids to keep your urine clear or pale yellow.  Keep all follow-up visits as told by your health care provider. This is important. You may need to be tested for infection again 3 months after treatment. How is this prevented? The only sure way to prevent chlamydia is to avoid sexual intercourse. However, you can lower your risk by:  Using latex condoms correctly every time you have sexual intercourse.  Not having multiple sexual partners.  Asking if your sexual partner has been tested for STIs and had negative results.  Contact a health care provider if:  You develop new symptoms or your symptoms do not get better after completing treatment.  You have a fever or chills.  You have pain during sexual intercourse.  You develop new joint pain or swelling near your joints.  You have pain or soreness in your testicles. Get help right away if:  Your pain gets worse and does not get better with medicine.  You have abnormal discharge.  You develop flu-like symptoms, such as night sweats, sore throat, or muscle   aches. Summary  Chlamydia is an STD (sexually transmitted disease). It is a bacterial infection that spreads (is contagious) through sexual contact.  This condition is not difficult to treat, however, if left untreated, it can lead to more serious health problems.  In some cases,  there may not be any symptoms for this condition (asymptomatic).  This condition is treated with antibiotic medicines.  Using latex condoms correctly every time you have sexual intercourse can help prevent chlamydia. This information is not intended to replace advice given to you by your health care provider. Make sure you discuss any questions you have with your health care provider. Document Released: 04/30/2005 Document Revised: 04/16/2016 Document Reviewed: 04/16/2016 Elsevier Interactive Patient Education  2017 [employer] Inc.  

## 2017-02-18 ENCOUNTER — Ambulatory Visit: Payer: Medicaid Other

## 2017-02-20 ENCOUNTER — Other Ambulatory Visit: Payer: Self-pay | Admitting: Emergency Medicine

## 2017-02-20 ENCOUNTER — Other Ambulatory Visit: Payer: Self-pay

## 2017-02-20 ENCOUNTER — Ambulatory Visit: Payer: Self-pay

## 2017-02-20 DIAGNOSIS — A749 Chlamydial infection, unspecified: Secondary | ICD-10-CM

## 2017-02-20 NOTE — Progress Notes (Signed)
Patient came in to collect urine for recheck of GC/Chl.

## 2017-02-21 LAB — C. TRACHOMATIS/N. GONORRHOEAE RNA
C. TRACHOMATIS RNA, TMA: NOT DETECTED
N. GONORRHOEAE RNA, TMA: NOT DETECTED

## 2017-09-28 ENCOUNTER — Observation Stay
Admission: EM | Admit: 2017-09-28 | Discharge: 2017-09-29 | Disposition: A | Discharge status: Home / Self Care | Payer: BLUE CROSS/BLUE SHIELD | Attending: Surgery | Admitting: Surgery

## 2017-09-28 ENCOUNTER — Encounter
Admission: EM | Disposition: A | Discharge status: Home / Self Care | Payer: Self-pay | Source: Home / Self Care | Attending: Emergency Medicine

## 2017-09-28 ENCOUNTER — Emergency Department: Payer: BLUE CROSS/BLUE SHIELD | Admitting: Certified Registered"

## 2017-09-28 ENCOUNTER — Emergency Department: Payer: BLUE CROSS/BLUE SHIELD

## 2017-09-28 ENCOUNTER — Other Ambulatory Visit: Payer: Self-pay

## 2017-09-28 ENCOUNTER — Encounter: Payer: Self-pay | Admitting: Emergency Medicine

## 2017-09-28 DIAGNOSIS — M419 Scoliosis, unspecified: Secondary | ICD-10-CM | POA: Insufficient documentation

## 2017-09-28 DIAGNOSIS — Z8489 Family history of other specified conditions: Secondary | ICD-10-CM | POA: Diagnosis not present

## 2017-09-28 DIAGNOSIS — Z91010 Allergy to peanuts: Secondary | ICD-10-CM | POA: Diagnosis not present

## 2017-09-28 DIAGNOSIS — Z79899 Other long term (current) drug therapy: Secondary | ICD-10-CM | POA: Insufficient documentation

## 2017-09-28 DIAGNOSIS — K3580 Unspecified acute appendicitis: Secondary | ICD-10-CM | POA: Diagnosis present

## 2017-09-28 DIAGNOSIS — R1031 Right lower quadrant pain: Secondary | ICD-10-CM

## 2017-09-28 DIAGNOSIS — L709 Acne, unspecified: Secondary | ICD-10-CM | POA: Insufficient documentation

## 2017-09-28 DIAGNOSIS — E669 Obesity, unspecified: Secondary | ICD-10-CM | POA: Insufficient documentation

## 2017-09-28 DIAGNOSIS — Z6820 Body mass index (BMI) 20.0-20.9, adult: Secondary | ICD-10-CM | POA: Diagnosis not present

## 2017-09-28 DIAGNOSIS — K37 Unspecified appendicitis: Secondary | ICD-10-CM | POA: Diagnosis present

## 2017-09-28 HISTORY — PX: LAPAROSCOPIC APPENDECTOMY: SHX408

## 2017-09-28 LAB — COMPREHENSIVE METABOLIC PANEL
ALK PHOS: 70 U/L (ref 38–126)
ALT: 17 U/L (ref 17–63)
AST: 32 U/L (ref 15–41)
Albumin: 5 g/dL (ref 3.5–5.0)
Anion gap: 14 (ref 5–15)
BUN: 13 mg/dL (ref 6–20)
CALCIUM: 9.4 mg/dL (ref 8.9–10.3)
CO2: 24 mmol/L (ref 22–32)
Chloride: 98 mmol/L — ABNORMAL LOW (ref 101–111)
Creatinine, Ser: 1.23 mg/dL (ref 0.61–1.24)
Glucose, Bld: 122 mg/dL — ABNORMAL HIGH (ref 65–99)
Potassium: 4.1 mmol/L (ref 3.5–5.1)
Sodium: 136 mmol/L (ref 135–145)
Total Bilirubin: 1.8 mg/dL — ABNORMAL HIGH (ref 0.3–1.2)
Total Protein: 8.2 g/dL — ABNORMAL HIGH (ref 6.5–8.1)

## 2017-09-28 LAB — CBC
HCT: 46.2 % (ref 40.0–52.0)
Hemoglobin: 15.7 g/dL (ref 13.0–18.0)
MCH: 29.4 pg (ref 26.0–34.0)
MCHC: 34 g/dL (ref 32.0–36.0)
MCV: 86.6 fL (ref 80.0–100.0)
PLATELETS: 212 10*3/uL (ref 150–440)
RBC: 5.34 MIL/uL (ref 4.40–5.90)
RDW: 13.6 % (ref 11.5–14.5)
WBC: 24.1 10*3/uL — AB (ref 3.8–10.6)

## 2017-09-28 LAB — LIPASE, BLOOD: Lipase: 21 U/L (ref 11–51)

## 2017-09-28 SURGERY — APPENDECTOMY, LAPAROSCOPIC
Anesthesia: General

## 2017-09-28 MED ORDER — PROMETHAZINE HCL 25 MG/ML IJ SOLN: 6.2500 mg | INTRAMUSCULAR | Status: DC | PRN

## 2017-09-28 MED ORDER — ACETAMINOPHEN 10 MG/ML IV SOLN
INTRAVENOUS | Status: DC | PRN
  Administered 2017-09-28: 1000 mg via INTRAVENOUS

## 2017-09-28 MED ORDER — MIDAZOLAM HCL 2 MG/2ML IJ SOLN
INTRAMUSCULAR | Status: AC
  Filled 2017-09-28: qty 2

## 2017-09-28 MED ORDER — PROPOFOL 500 MG/50ML IV EMUL
INTRAVENOUS | Status: AC
  Filled 2017-09-28: qty 50

## 2017-09-28 MED ORDER — FENTANYL CITRATE (PF) 100 MCG/2ML IJ SOLN: 25.0000 ug | INTRAMUSCULAR | Status: DC | PRN

## 2017-09-28 MED ORDER — SUGAMMADEX SODIUM 200 MG/2ML IV SOLN
INTRAVENOUS | Status: DC | PRN
  Administered 2017-09-28: 200 mg via INTRAVENOUS

## 2017-09-28 MED ORDER — ONDANSETRON 4 MG PO TBDP: 4.0000 mg | ORAL_TABLET | Freq: Four times a day (QID) | ORAL | Status: DC | PRN

## 2017-09-28 MED ORDER — LACTATED RINGERS IV SOLN
INTRAVENOUS | Status: DC
  Administered 2017-09-28 – 2017-09-29 (×2): via INTRAVENOUS

## 2017-09-28 MED ORDER — PHENYLEPHRINE HCL 10 MG/ML IJ SOLN
INTRAMUSCULAR | Status: DC | PRN
  Administered 2017-09-28 (×2): 100 ug via INTRAVENOUS

## 2017-09-28 MED ORDER — PIPERACILLIN-TAZOBACTAM 3.375 G IVPB
3.3750 g | Freq: Once | INTRAVENOUS | Status: AC
  Administered 2017-09-28: 3.375 g via INTRAVENOUS
  Filled 2017-09-28: qty 50

## 2017-09-28 MED ORDER — KETOROLAC TROMETHAMINE 30 MG/ML IJ SOLN
30.0000 mg | Freq: Four times a day (QID) | INTRAMUSCULAR | Status: DC
  Administered 2017-09-28 – 2017-09-29 (×3): 30 mg via INTRAVENOUS
  Filled 2017-09-28 (×3): qty 1

## 2017-09-28 MED ORDER — ACETAMINOPHEN 500 MG PO TABS
1000.0000 mg | ORAL_TABLET | Freq: Four times a day (QID) | ORAL | Status: DC
  Administered 2017-09-28 – 2017-09-29 (×3): 1000 mg via ORAL
  Filled 2017-09-28 (×3): qty 2

## 2017-09-28 MED ORDER — ONDANSETRON HCL 4 MG/2ML IJ SOLN
INTRAMUSCULAR | Status: DC | PRN
  Administered 2017-09-28: 4 mg via INTRAVENOUS

## 2017-09-28 MED ORDER — DEXAMETHASONE SODIUM PHOSPHATE 10 MG/ML IJ SOLN
INTRAMUSCULAR | Status: DC | PRN
  Administered 2017-09-28: 10 mg via INTRAVENOUS

## 2017-09-28 MED ORDER — DIPHENHYDRAMINE HCL 12.5 MG/5ML PO ELIX
12.5000 mg | ORAL_SOLUTION | Freq: Four times a day (QID) | ORAL | Status: DC | PRN
  Filled 2017-09-28: qty 5

## 2017-09-28 MED ORDER — PROPOFOL 10 MG/ML IV BOLUS
INTRAVENOUS | Status: DC | PRN
  Administered 2017-09-28: 100 mg via INTRAVENOUS
  Administered 2017-09-28: 200 mg via INTRAVENOUS

## 2017-09-28 MED ORDER — OXYCODONE HCL 5 MG PO TABS
5.0000 mg | ORAL_TABLET | ORAL | Status: DC | PRN
  Administered 2017-09-28: 5 mg via ORAL
  Administered 2017-09-29: 10 mg via ORAL
  Filled 2017-09-28: qty 2
  Filled 2017-09-28: qty 1

## 2017-09-28 MED ORDER — LIDOCAINE HCL (CARDIAC) PF 100 MG/5ML IV SOSY
PREFILLED_SYRINGE | INTRAVENOUS | Status: DC | PRN
  Administered 2017-09-28: 100 mg via INTRAVENOUS

## 2017-09-28 MED ORDER — PANTOPRAZOLE SODIUM 40 MG PO TBEC
40.0000 mg | DELAYED_RELEASE_TABLET | Freq: Every day | ORAL | Status: DC
  Administered 2017-09-29: 40 mg via ORAL
  Filled 2017-09-28: qty 1

## 2017-09-28 MED ORDER — SODIUM CHLORIDE 0.9 % IV BOLUS
1000.0000 mL | Freq: Once | INTRAVENOUS | Status: AC
  Administered 2017-09-28: 1000 mL via INTRAVENOUS

## 2017-09-28 MED ORDER — FENTANYL CITRATE (PF) 100 MCG/2ML IJ SOLN
INTRAMUSCULAR | Status: AC
  Filled 2017-09-28: qty 2

## 2017-09-28 MED ORDER — BUPIVACAINE-EPINEPHRINE (PF) 0.25% -1:200000 IJ SOLN
INTRAMUSCULAR | Status: AC
  Filled 2017-09-28: qty 30

## 2017-09-28 MED ORDER — LIDOCAINE HCL (PF) 2 % IJ SOLN
INTRAMUSCULAR | Status: AC
  Filled 2017-09-28: qty 10

## 2017-09-28 MED ORDER — ACETAMINOPHEN 10 MG/ML IV SOLN
INTRAVENOUS | Status: AC
  Filled 2017-09-28: qty 100

## 2017-09-28 MED ORDER — MIDAZOLAM HCL 2 MG/2ML IJ SOLN
INTRAMUSCULAR | Status: DC | PRN
  Administered 2017-09-28: 2 mg via INTRAVENOUS

## 2017-09-28 MED ORDER — LACTATED RINGERS IV SOLN
INTRAVENOUS | Status: DC | PRN
  Administered 2017-09-28: 17:00:00 via INTRAVENOUS

## 2017-09-28 MED ORDER — PIPERACILLIN-TAZOBACTAM 3.375 G IVPB 30 MIN
3.3750 g | Freq: Once | INTRAVENOUS | Status: AC
  Administered 2017-09-28: 3.375 g via INTRAVENOUS
  Filled 2017-09-28: qty 50

## 2017-09-28 MED ORDER — ROCURONIUM BROMIDE 100 MG/10ML IV SOLN
INTRAVENOUS | Status: DC | PRN
  Administered 2017-09-28: 40 mg via INTRAVENOUS

## 2017-09-28 MED ORDER — SODIUM CHLORIDE 0.9 % IV BOLUS
1000.0000 mL | Freq: Once | INTRAVENOUS | Status: AC
Start: 2017-09-28 — End: 2017-09-28
  Administered 2017-09-28: 1000 mL via INTRAVENOUS

## 2017-09-28 MED ORDER — ENOXAPARIN SODIUM 40 MG/0.4ML ~~LOC~~ SOLN
40.0000 mg | SUBCUTANEOUS | Status: DC
  Filled 2017-09-28: qty 0.4

## 2017-09-28 MED ORDER — FENTANYL CITRATE (PF) 100 MCG/2ML IJ SOLN
INTRAMUSCULAR | Status: DC | PRN
  Administered 2017-09-28: 100 ug via INTRAVENOUS

## 2017-09-28 MED ORDER — DIPHENHYDRAMINE HCL 50 MG/ML IJ SOLN: 12.5000 mg | Freq: Four times a day (QID) | INTRAMUSCULAR | Status: DC | PRN

## 2017-09-28 MED ORDER — IOPAMIDOL (ISOVUE-300) INJECTION 61%
100.0000 mL | Freq: Once | INTRAVENOUS | Status: AC | PRN
  Administered 2017-09-28: 100 mL via INTRAVENOUS

## 2017-09-28 MED ORDER — ONDANSETRON HCL 4 MG/2ML IJ SOLN: 4.0000 mg | Freq: Four times a day (QID) | INTRAMUSCULAR | Status: DC | PRN

## 2017-09-28 MED ORDER — MORPHINE SULFATE (PF) 2 MG/ML IV SOLN: 2.0000 mg | INTRAVENOUS | Status: DC | PRN

## 2017-09-28 SURGICAL SUPPLY — 36 items
APPLIER CLIP 5 13 M/L LIGAMAX5 (MISCELLANEOUS) ×3
BLADE CLIPPER SURG (BLADE) ×3 IMPLANT
CANISTER SUCT 1200ML W/VALVE (MISCELLANEOUS) ×3 IMPLANT
CHLORAPREP W/TINT 26ML (MISCELLANEOUS) ×3 IMPLANT
CLIP APPLIE 5 13 M/L LIGAMAX5 (MISCELLANEOUS) ×1 IMPLANT
CUTTER FLEX LINEAR 45M (STAPLE) ×3 IMPLANT
DERMABOND ADVANCED (GAUZE/BANDAGES/DRESSINGS) ×2
DERMABOND ADVANCED .7 DNX12 (GAUZE/BANDAGES/DRESSINGS) ×1 IMPLANT
ELECT CAUTERY BLADE 6.4 (BLADE) ×3 IMPLANT
ELECT REM PT RETURN 9FT ADLT (ELECTROSURGICAL) ×3
ELECTRODE REM PT RTRN 9FT ADLT (ELECTROSURGICAL) ×1 IMPLANT
GLOVE BIO SURGEON STRL SZ7 (GLOVE) ×12 IMPLANT
GOWN STRL REUS W/ TWL LRG LVL3 (GOWN DISPOSABLE) ×2 IMPLANT
GOWN STRL REUS W/TWL LRG LVL3 (GOWN DISPOSABLE) ×4
IRRIGATION STRYKERFLOW (MISCELLANEOUS) ×1 IMPLANT
IRRIGATOR STRYKERFLOW (MISCELLANEOUS) ×3
IV NS 1000ML (IV SOLUTION) ×2
IV NS 1000ML BAXH (IV SOLUTION) ×1 IMPLANT
NEEDLE HYPO 22GX1.5 SAFETY (NEEDLE) ×3 IMPLANT
NS IRRIG 500ML POUR BTL (IV SOLUTION) ×3 IMPLANT
PACK LAP CHOLECYSTECTOMY (MISCELLANEOUS) ×3 IMPLANT
PENCIL ELECTRO HAND CTR (MISCELLANEOUS) ×3 IMPLANT
POUCH SPECIMEN RETRIEVAL 10MM (ENDOMECHANICALS) ×3 IMPLANT
RELOAD 45 VASCULAR/THIN (ENDOMECHANICALS) ×3 IMPLANT
RELOAD STAPLE TA45 3.5 REG BLU (ENDOMECHANICALS) ×3 IMPLANT
SCISSORS METZENBAUM CVD 33 (INSTRUMENTS) ×3 IMPLANT
SHEARS HARMONIC ACE PLUS 36CM (ENDOMECHANICALS) ×3 IMPLANT
SLEEVE ENDOPATH XCEL 5M (ENDOMECHANICALS) ×3 IMPLANT
SPONGE LAP 18X18 5 PK (GAUZE/BANDAGES/DRESSINGS) ×3 IMPLANT
SUT MNCRL AB 4-0 PS2 18 (SUTURE) ×3 IMPLANT
SUT VICRYL 0 AB UR-6 (SUTURE) ×6 IMPLANT
SYR 20CC LL (SYRINGE) ×3 IMPLANT
TRAY FOLEY MTR SLVR 16FR STAT (SET/KITS/TRAYS/PACK) IMPLANT
TROCAR XCEL BLUNT TIP 100MML (ENDOMECHANICALS) ×3 IMPLANT
TROCAR XCEL NON-BLD 5MMX100MML (ENDOMECHANICALS) ×6 IMPLANT
TUBING INSUF HEATED (TUBING) ×3 IMPLANT

## 2017-09-28 NOTE — Anesthesia Post-op Follow-up Note (Signed)
Anesthesia QCDR form completed.        

## 2017-09-28 NOTE — Transfer of Care (Signed)
Immediate Anesthesia Transfer of Care Note  Patient: Jonathan Peterson.  Procedure(s) Performed: APPENDECTOMY LAPAROSCOPIC (N/A )  Patient Location: PACU  Anesthesia Type:General  Level of Consciousness: drowsy and patient cooperative  Airway & Oxygen Therapy: Patient Spontanous Breathing and Patient connected to nasal cannula oxygen  Post-op Assessment: Report given to RN and Post -op Vital signs reviewed and stable  Post vital signs: Reviewed and stable  Last Vitals:  Vitals Value Taken Time  BP 130/79 09/28/2017  6:03 PM  Temp    Pulse 99 09/28/2017  6:03 PM  Resp 17 09/28/2017  6:03 PM  SpO2 100 % 09/28/2017  6:03 PM  Vitals shown include unvalidated device data.  Last Pain:  Vitals:   09/28/17 1123  TempSrc:   PainSc: 5          Complications: No apparent anesthesia complications

## 2017-09-28 NOTE — Anesthesia Procedure Notes (Addendum)
Procedure Name: Intubation Date/Time: 09/28/2017 5:04 PM Performed by: Sherrine Maples, CRNA Pre-anesthesia Checklist: Patient identified, Patient being monitored, Timeout performed, Emergency Drugs available and Suction available Patient Re-evaluated:Patient Re-evaluated prior to induction Oxygen Delivery Method: Circle system utilized Preoxygenation: Pre-oxygenation with 100% oxygen Induction Type: IV induction Ventilation: Mask ventilation without difficulty Laryngoscope Size: Mac and 3 Grade View: Grade I Tube type: Oral Tube size: 7.0 mm Number of attempts: 1 Airway Equipment and Method: Stylet Placement Confirmation: ETT inserted through vocal cords under direct vision,  positive ETCO2 and breath sounds checked- equal and bilateral Secured at: 21 cm Tube secured with: Tape Dental Injury: Teeth and Oropharynx as per pre-operative assessment

## 2017-09-28 NOTE — ED Provider Notes (Signed)
[facility] Emergency Department Provider Note  ____________________________________________   I have reviewed the triage vital signs and the nursing notes.   HISTORY  Chief Complaint Abdominal Pain   History limited by: Not Limited   HPI Jonathan Peterson. is a 22 y.o. male who presents to the emergency department today because of concerns for abdominal pain.  It started 2 nights ago.  Is located in the right lower quadrant and lower abdomen.  Patient states it started shortly after he had finished eating.  States that since it started it does come and go.  He has not noticed any pattern.  He has not noticed any blood.  Has had some loose stool.  Has had decreased appetite.  No fevers. Had similar pain and was seen in the emergency department last year for it.   Per medical record review patient has a history of ER visit with RLQ abd pain almost one year ago with negative CT scan.   Past Medical History:  Diagnosis Date  . Acne   . Obesity   . Scoliosis     Patient Active Problem List   Diagnosis Date Noted  . Chlamydia infection 12/25/2016  . Midline thoracic back pain 07/05/2014  . Spinal asymmetry (< 10 degrees) 07/05/2014    History reviewed. No pertinent surgical history.  Prior to Admission medications   Medication Sig Start Date End Date Taking? Authorizing Provider  ondansetron (ZOFRAN) 4 MG tablet Take 1 tablet (4 mg total) by mouth daily as needed for nausea or vomiting. Patient not taking: Reported on 12/20/2016 12/05/16   Jeanmarie Plant, MD    Allergies Peanut oil  Family History  Problem Relation Age of Onset  . Allergic rhinitis Sister     Social History Social History   Tobacco Use  . Smoking status: Never Smoker  . Smokeless tobacco: Never Used  Substance Use Topics  . Alcohol use: Yes    Comment: occasional  . Drug use: Yes    Types: Marijuana    Comment: 3-4 times daily    Review of Systems Constitutional: No  fever/chills Eyes: No visual changes. ENT: No sore throat. Cardiovascular: Denies chest pain. Respiratory: Denies shortness of breath. Gastrointestinal: Positive for right lower quadrant pain. Genitourinary: Negative for dysuria. Musculoskeletal: Negative for back pain. Skin: Negative for rash. Neurological: Negative for headaches, focal weakness or numbness.  ____________________________________________   PHYSICAL EXAM:  VITAL SIGNS: ED Triage Vitals  Enc Vitals Group     BP 09/28/17 1119 (!) 114/97     Pulse Rate 09/28/17 1119 97     Resp 09/28/17 1119 16     Temp 09/28/17 1119 99.6 F (37.6 C)     Temp Source 09/28/17 1119 Oral     SpO2 09/28/17 1119 100 %     Weight 09/28/17 1123 160 lb (72.6 kg)     Height 09/28/17 1123  (1.88 m)     Head Circumference --      Peak Flow --      Pain Score 09/28/17 1123 5   Constitutional: Alert and oriented. Well appearing and in no distress. Eyes: Conjunctivae are normal.  ENT   Head: Normocephalic and atraumatic.   Nose: No congestion/rhinnorhea.   Mouth/Throat: Mucous membranes are moist.   Neck: No stridor. Hematological/Lymphatic/Immunilogical: No cervical lymphadenopathy. Cardiovascular: Normal rate, regular rhythm.  No murmurs, rubs, or gallops.  Respiratory: Normal respiratory effort without tachypnea nor retractions. Breath sounds are clear and equal bilaterally.  No wheezes/rales/rhonchi. Gastrointestinal: Soft and tender to palpation in the right lower quadrant.  Genitourinary: Deferred Musculoskeletal: Normal range of motion in all extremities. No lower extremity edema. Neurologic:  Normal speech and language. No gross focal neurologic deficits are appreciated.  Skin:  Skin is warm, dry and intact. No rash noted. Psychiatric: Mood and affect are normal. Speech and behavior are normal. Patient exhibits appropriate insight and judgment.  ____________________________________________    LABS (pertinent  positives/negatives)  Lipase 21 CMP na 136, k 4.1, glu 122, cr 1.23 CBC wbc 24.1, hgb 15.7, plt 212  ____________________________________________   EKG  None  ____________________________________________    RADIOLOGY  CT abd/pel Concern for acute appendicits  I, Nimco Bivens, personally discussed these images and results by phone with the on-call radiologist and used this discussion as part of my medical decision making.   ____________________________________________   PROCEDURES  Procedures  ____________________________________________   INITIAL IMPRESSION / ASSESSMENT AND PLAN / ED COURSE  Pertinent labs & imaging results that were available during my care of the patient were reviewed by me and considered in my medical decision making (see chart for details).  Patient presented to the emergency department today because of concerns for right lower quadrant abdominal pain.  On exam he is tender over McBurney's point.  Patient did have significant leukocytosis. CT scan was obtained which is concerning for acute appendicitis. Discussed this result with the patient. Paged surgery to evaluate.    ____________________________________________   FINAL CLINICAL IMPRESSION(S) / ED DIAGNOSES  Final diagnoses:  Acute appendicitis, unspecified acute appendicitis type  Right lower quadrant abdominal pain     Note: This dictation was prepared with Dragon dictation. Any transcriptional errors that result from this process are unintentional     Phineas Semen, MD 09/28/17 [phone]

## 2017-09-28 NOTE — ED Notes (Signed)
Informed consent completed 

## 2017-09-28 NOTE — ED Triage Notes (Signed)
Patient presents to the ED with abdominal pain x 2 days with nausea and vomiting.  Patient states he took pepto bismol without much relief.  Patient reports having 1 major episode of vomiting last night and the night before.  Patient reports constipation until this morning when he had 1 episode of loose stools.  Patient has more tenderness on the right side.  Abdominal tenderness does not appear to be severe.  Patient is sitting somewhat comfortably in triage.

## 2017-09-28 NOTE — Anesthesia Preprocedure Evaluation (Signed)
Anesthesia Evaluation  Patient identified by MRN, date of birth, ID band Patient awake    Reviewed: Allergy & Precautions, H&P , NPO status , Patient's Chart, lab work & pertinent test results, reviewed documented beta blocker date and time   History of Anesthesia Complications Negative for: history of anesthetic complications  Airway Mallampati: I  TM Distance: >3 FB Neck ROM: full    Dental  (+) Dental Advidsory Given, Teeth Intact   Pulmonary neg pulmonary ROS,           Cardiovascular Exercise Tolerance: Good negative cardio ROS       Neuro/Psych negative neurological ROS  negative psych ROS   GI/Hepatic negative GI ROS, Neg liver ROS,   Endo/Other  negative endocrine ROS  Renal/GU negative Renal ROS  negative genitourinary   Musculoskeletal   Abdominal   Peds  Hematology negative hematology ROS (+)   Anesthesia Other Findings Past Medical History: No date: Acne No date: Obesity No date: Scoliosis   Reproductive/Obstetrics negative OB ROS                             Anesthesia Physical Anesthesia Plan  ASA: I  Anesthesia Plan: General   Post-op Pain Management:    Induction: Intravenous  PONV Risk Score and Plan: 2 and Ondansetron and Dexamethasone  Airway Management Planned: Oral ETT  Additional Equipment:   Intra-op Plan:   Post-operative Plan: Extubation in OR  Informed Consent: I have reviewed the patients History and Physical, chart, labs and discussed the procedure including the risks, benefits and alternatives for the proposed anesthesia with the patient or authorized representative who has indicated his/her understanding and acceptance.   Dental Advisory Given  Plan Discussed with: Anesthesiologist, CRNA and Surgeon  Anesthesia Plan Comments:         Anesthesia Quick Evaluation

## 2017-09-28 NOTE — ED Triage Notes (Signed)
FIRST NURSE NOTE-abdominal pain with vomiting X 2 days.  Pain is RLQ per pt.  No fever per mom. Has history of same per mom.

## 2017-09-28 NOTE — H&P (Signed)
Patient ID: Jonathan Peterson., male   DOB: [birth year], 22 y.o.   MRN: [id number]  HPI Jonathan Peterson. is a 22 y.o. male resents with a 2-day history of lower abdominal pain.  The patient reports the pain is located in the right lower quadrant and is moderate to severe in intensity.  Sharp and intermittent.  He denies any fevers or chills.  Some nausea but no vomiting. Pain is worse when he moves.  He had a previous episode about a year ago but at that time the appendix was not visualized. I have personally reviewed his CT scan this time and there is evidence of dilation of the appendix with some inflammatory response.  No free air no abscess. White count is 24,000, normal creatinine mildly elevated bilirubin. Is able to perform more than 6 METS of activity without any shortness of breath or chest pain  HPI  Past Medical History:  Diagnosis Date  . Acne   . Obesity   . Scoliosis     History reviewed. No pertinent surgical history.  Family History  Problem Relation Age of Onset  . Allergic rhinitis Sister     Social History Social History   Tobacco Use  . Smoking status: Never Smoker  . Smokeless tobacco: Never Used  Substance Use Topics  . Alcohol use: Yes    Comment: occasional  . Drug use: Yes    Types: Marijuana    Comment: 3-4 times daily    Allergies  Allergen Reactions  . Peanut Oil Hives    Current Facility-Administered Medications  Medication Dose Route Frequency Provider Last Rate Last Dose  . azithromycin (ZITHROMAX) tablet 1,000 mg  1,000 mg Oral Once Maurice Small E, FNP      . sodium chloride 0.9 % bolus 1,000 mL  1,000 mL Intravenous Once Trinitie Mcgirr, [place] F, MD 984 mL/hr at 09/28/17 1600 1,000 mL at 09/28/17 1600   Current Outpatient Medications  Medication Sig Dispense Refill  . ondansetron (ZOFRAN) 4 MG tablet Take 1 tablet (4 mg total) by mouth daily as needed for nausea or vomiting. (Patient not taking: Reported on 12/20/2016) 10 tablet 0     Review  of Systems Full ROS  was asked and was negative except for the information on the HPI  Physical Exam Blood pressure (!) 114/97, pulse 97, temperature 99.6 F (37.6 C), temperature source Oral, resp. rate 16, height  (1.88 m), weight 72.6 kg (160 lb), SpO2 100 %. CONSTITUTIONAL: NAD EYES: Pupils are equal, round, and reactive to light, Sclera are non-icteric. EARS, NOSE, MOUTH AND THROAT: The oropharynx is clear. The oral mucosa is pink and moist. Hearing is intact to voice. LYMPH NODES:  Lymph nodes in the neck are normal. RESPIRATORY:  Lungs are clear. There is normal respiratory effort, with equal breath sounds bilaterally, and without pathologic use of accessory muscles. CARDIOVASCULAR: Heart is regular without murmurs, gallops, or rubs. GI: The abdomen is soft,. There are normal bowel sounds in all quadrants. TTP RLQ , no peritonitis. GU: Rectal deferred.   MUSCULOSKELETAL: Normal muscle strength and tone. No cyanosis or edema.   SKIN: Turgor is good and there are no pathologic skin lesions or ulcers. NEUROLOGIC: Motor and sensation is grossly normal. Cranial nerves are grossly intact. PSYCH:  Oriented to person, place and time. Affect is normal.  Data Reviewed  I have personally reviewed the patient's imaging, laboratory findings and medical records.    Assessment/Plan 22 year old male with classic signs and symptoms  consistent with acute appendicitis confirmed by CT scan. The risks, benefits, complications, treatment options, and expected outcomes were discussed with the patient. The treatment of antibiotics alone was discussed giving a 20% chance that this could fail and surgery would be necessary.  Also discussed continuing to the operating room for Laparoscopic Appendectomy.  The possibilities of  bleeding, recurrent infection, perforation of viscus, finding a normal appendix, the need for additional procedures, failure to diagnose a condition, conversion to open procedure and  creating a complication requiring transfusion or further operations were discussed. The patient was given the opportunity to ask questions and have them answered.  Patient would like to proceed with Laparoscopic Appendectomy and consent was obtained.  We will start broad-spectrum antibiotics, IV fluid bolus and post him for a laparoscopic appendectomy tonight.  Sterling Big, MD FACS General Surgeon 09/28/2017, 4:16 PM

## 2017-09-28 NOTE — Progress Notes (Signed)
15 minute call to floor. 

## 2017-09-28 NOTE — Anesthesia Postprocedure Evaluation (Signed)
Anesthesia Post Note  Patient: Jonathan Peterson.  Procedure(s) Performed: APPENDECTOMY LAPAROSCOPIC (N/A )  Patient location during evaluation: PACU Anesthesia Type: General Level of consciousness: awake and alert Pain management: pain level controlled Vital Signs Assessment: post-procedure vital signs reviewed and stable Respiratory status: spontaneous breathing, nonlabored ventilation, respiratory function stable and patient connected to nasal cannula oxygen Cardiovascular status: blood pressure returned to baseline and stable Postop Assessment: no apparent nausea or vomiting Anesthetic complications: no     Last Vitals:  Vitals:   09/28/17 1848 09/28/17 1905  BP: 126/75 112/75  Pulse: 93 87  Resp: 17 15  Temp:  37.2 C  SpO2: 100% 98%    Last Pain:  Vitals:   09/28/17 1833  TempSrc:   PainSc: 0-No pain                 Lenard Simmer

## 2017-09-28 NOTE — Op Note (Signed)
laparascopic appendectomy   Jonathan D Mcgaughy Jr. Date of operation:  09/28/2017  Indications: The patient presented with a history of  abdominal pain. Workup has revealed findings consistent with acute appendicitis.  Pre-operative Diagnosis: Acute appendicitis without mention of peritonitis  Post-operative Diagnosis: Same  Surgeon: Sterling Big, MD, FACS  Anesthesia: General with endotracheal tube  Findings: Acute non ruptured appendicitis  Estimated Blood Loss:5cc         Specimens: appendix         Complications:  none  Procedure Details  The patient was seen again in the preop area. The options of surgery versus observation were reviewed with the patient and/or family. The risks of bleeding, infection, recurrence of symptoms, negative laparoscopy, potential for an open procedure, bowel injury, abscess or infection, were all reviewed as well. The patient was taken to Operating Room, identified as [employer]. and the procedure verified as laparoscopic appendectomy. A Time Out was held and the above information confirmed.  The patient was placed in the supine position and general anesthesia was induced.  Antibiotic prophylaxis was administered and VT E prophylaxis was in place. A Foley catheter was placed by the nursing staff.   The abdomen was prepped and draped in a sterile fashion. An infraumbilical incision was made. A cutdown technique was used to enter the abdominal cavity. Two vicryl stitches were placed on the fascia and a Hasson trocar inserted. Pneumoperitoneum obtained. Two 5 mm ports were placed under direct visualization.   The appendix was identified and found to be acutely inflamed  The appendix was carefully dissected. The mesoappendix was divided withHarmonic scalpel. The base of the appendix was dissected out and divided with a standard load Endo GIA.The appendix was placed in a Endo Catch bag and removed via the Hasson port. The right lower quadrant and pelvis  was then irrigated with  normal saline which was aspirated. Inspection  failed to identify any additional bleeding and there were no signs of bowel injury. Again the right lower quadrant was inspected there was no sign of bleeding or bowel injury therefore pneumoperitoneum was released, all ports were removed.  The umbilical fascia was closed with 0 Vicryl interrupted sutures and the skin incisions were approximated with subcuticular 4-0 Monocryl. Dermabond was placed The patient tolerated the procedure well, there were no complications. The sponge lap and needle count were correct at the end of the procedure.  The patient was taken to the recovery room in stable condition to be admitted for continued care.    Sterling Big, MD FACS

## 2017-09-29 ENCOUNTER — Encounter: Payer: Self-pay | Admitting: Surgery

## 2017-09-29 MED ORDER — TRAMADOL HCL 50 MG PO TABS
50.0000 mg | ORAL_TABLET | Freq: Four times a day (QID) | ORAL | 0 refills | Status: AC | PRN
Start: 2017-09-29 — End: 2017-10-02

## 2017-09-29 NOTE — Progress Notes (Signed)
Jonathan Peterson Stella.  A and O x 4. VSS. Pt tolerating diet well. No complaints of pain or nausea. IV removed intact, prescriptions given. Pt voiced understanding of discharge instructions with no further questions. Pt discharged via wheelchair with axillary.    Allergies as of 09/29/2017      Reactions   Peanut Oil Hives      Medication List    STOP taking these medications   ondansetron 4 MG tablet Commonly known as:  ZOFRAN     TAKE these medications   traMADol 50 MG tablet Commonly known as:  ULTRAM Take 1 tablet (50 mg total) by mouth every 6 (six) hours as needed for up to 3 days for moderate pain.       Vitals:   09/29/17 0454 09/29/17 1154  BP: 132/82 121/78  Pulse: 69 69  Resp: 20 19  Temp: 98.5 F (36.9 C) 98.5 F (36.9 C)  SpO2: 100% 100%    Suzzanne Cloud

## 2017-09-29 NOTE — Discharge Instructions (Signed)

## 2017-09-29 NOTE — Discharge Summary (Signed)
Physician Discharge Summary  Patient ID: Jonathan Peterson. MRN: [id number] DOB/AGE: 03-Apr-1996 22 y.o.  Admit date: 09/28/2017 Discharge date: 09/29/2017  Admission Diagnoses: Acute appendicitis  Discharge Diagnoses:  Active Problems:   Appendicitis   Discharged Condition: good  Hospital Course: Patient with clinical and imaging finding of acute appendicitis. Underwent laparoscopic appendectomy. Tolerated well. Ambulating, voiding spontaneously, tolerating diet and pain controlled.   Consults: None  Significant Diagnostic Studies: CT scan with finding of acute inflamed appendix.   Treatments: antibiotics: Zosyn and surgery: Laparoscopic appendectomy  Discharge Exam: Blood pressure 132/82, pulse 69, temperature 98.5 F (36.9 C), temperature source Oral, resp. rate 20, height  (1.88 m), weight 72.6 kg (160 lb), SpO2 100 %. General appearance: alert and cooperative Resp: clear to auscultation bilaterally Cardio: regular rate and rhythm, S1, S2 normal, no murmur, click, rub or gallop GI: soft, non-tender; bowel sounds normal; no masses,  no organomegaly Incision/Wound:dry and clean. Dermabon in place.   Disposition: Discharge disposition: 01-Home or Self Care       Discharge Instructions    Diet - low sodium heart healthy   Complete by:  As directed      Allergies as of 09/29/2017      Reactions   Peanut Oil Hives      Medication List    STOP taking these medications   ondansetron 4 MG tablet Commonly known as:  ZOFRAN     TAKE these medications   traMADol 50 MG tablet Commonly known as:  ULTRAM Take 1 tablet (50 mg total) by mouth every 6 (six) hours as needed for up to 3 days for moderate pain.        Signed: Carolan Shiver 09/29/2017, 8:52 AM

## 2017-09-30 ENCOUNTER — Telehealth: Payer: Self-pay | Admitting: Surgery

## 2017-09-30 LAB — HIV ANTIBODY (ROUTINE TESTING W REFLEX): HIV SCREEN 4TH GENERATION: NONREACTIVE

## 2017-09-30 NOTE — Telephone Encounter (Signed)
Spoke with patient at this time. He states he does not like taking the Tramadol, causes shortness of breath.  We discussed trying Ibuprofen and tylenol and he would like to try this before getting something stronger. He will call back tomorrow if the ibuprofen and tylenol doesn't;t help.

## 2017-09-30 NOTE — Telephone Encounter (Signed)
Patient needs to discuss a change in pain med. Currently taking tramadol but states that it makes him have shortness of breath. He would like to get a different medication. Please advise.

## 2017-09-30 NOTE — Telephone Encounter (Signed)
Patients calling again about the pain medication. Please call patient and advise.

## 2017-10-01 LAB — SURGICAL PATHOLOGY

## 2017-10-15 ENCOUNTER — Other Ambulatory Visit: Payer: Self-pay

## 2017-10-16 ENCOUNTER — Encounter: Payer: Self-pay | Admitting: Surgery

## 2017-10-16 ENCOUNTER — Ambulatory Visit (INDEPENDENT_AMBULATORY_CARE_PROVIDER_SITE_OTHER): Payer: BLUE CROSS/BLUE SHIELD | Admitting: Surgery

## 2017-10-16 VITALS — BP 119/78 | HR 80 | Temp 97.6°F | Ht 74.0 in | Wt 169.0 lb

## 2017-10-16 DIAGNOSIS — Z09 Encounter for follow-up examination after completed treatment for conditions other than malignant neoplasm: Secondary | ICD-10-CM

## 2017-10-16 NOTE — Patient Instructions (Signed)

## 2017-10-16 NOTE — Progress Notes (Signed)
S/p lap appy 5/18 by Dr. Aleen CampiPiscoya Doing well, no issues + Po, No fevers Path d/w pt in detail  PE NAd Abd: soft, NT, incisions c/d/i  A/P Doing well No heavy lifting Work excuse provided RTC prn

## 2017-11-13 ENCOUNTER — Encounter: Payer: Self-pay | Admitting: Surgery

## 2017-11-22 IMAGING — CT CT ABD-PELV W/ CM
2 of 4 series · 15 of 46 positions shown, 17 images · IV contrast (iopamidol)
Comparison: None.

CLINICAL DATA: Right lower quadrant abdominal pain for 1 day with
vomiting.

EXAM:
CT ABDOMEN AND PELVIS WITH CONTRAST
TECHNIQUE: Multidetector CT imaging of the abdomen and pelvis was performed
using the standard protocol following bolus administration of
intravenous contrast.
CONTRAST:  100mL VTXTDK-RAA IOPAMIDOL (VTXTDK-RAA) INJECTION 61%

[Series 2: routine abd/pel with (person_name) · axial · 0.66mm/px · z∈[-508,-123]mm · 12 of 92 slices shown, 14 images]
[im 8/92  soft-tissue]
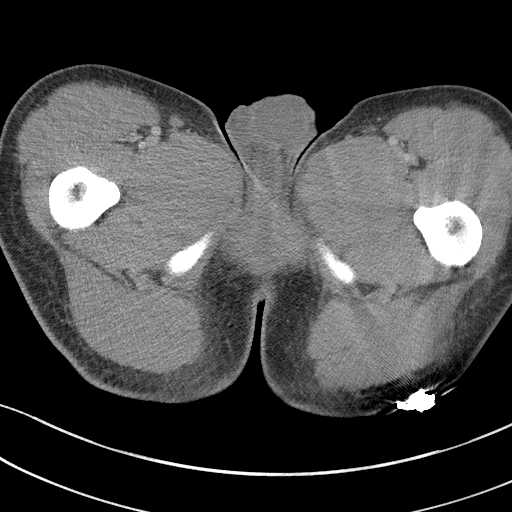
[im 8/92  bone]
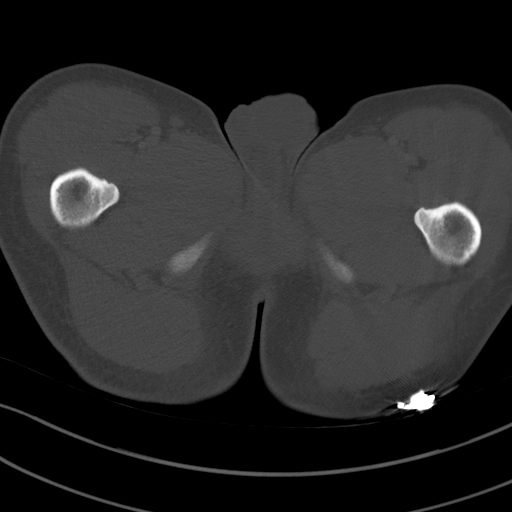
[im 15/92  soft-tissue]
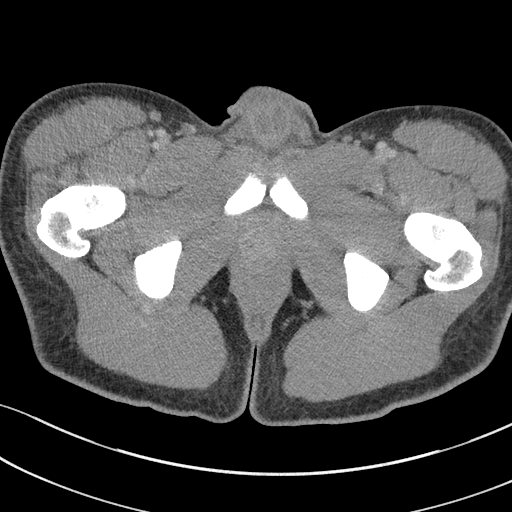
[im 22/92  soft-tissue]
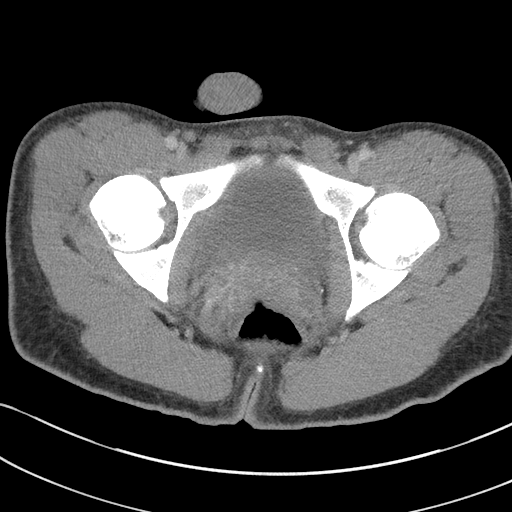
[im 29/92  soft-tissue]
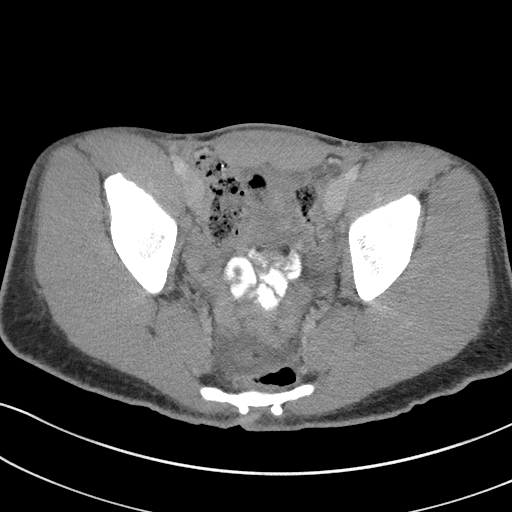
[im 36/92  soft-tissue]
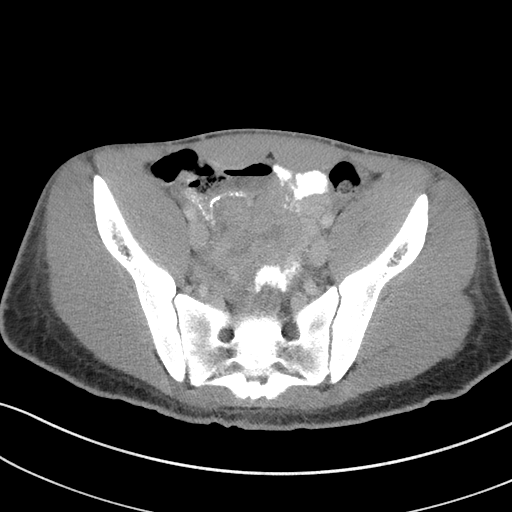
[im 43/92  soft-tissue]
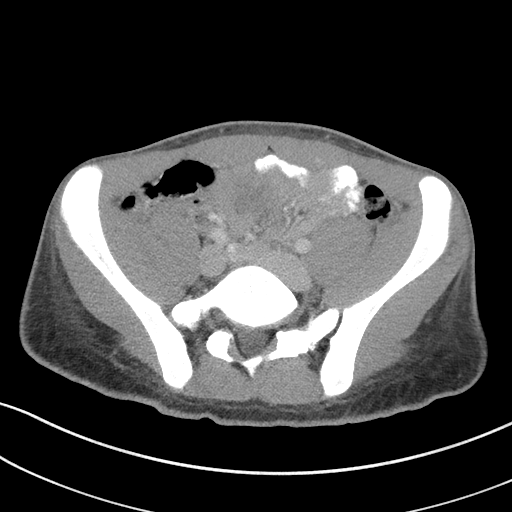
[im 50/92  soft-tissue]
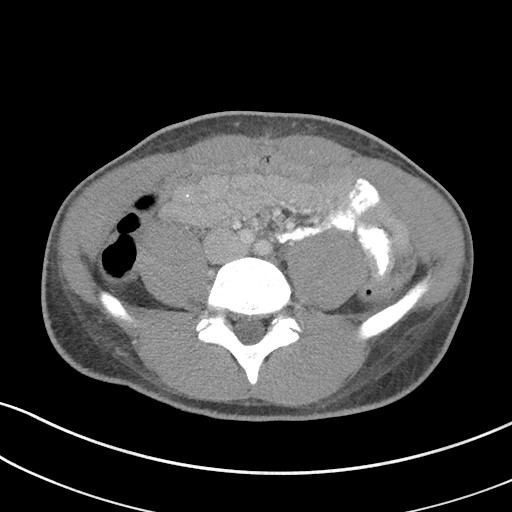
[im 57/92  soft-tissue]
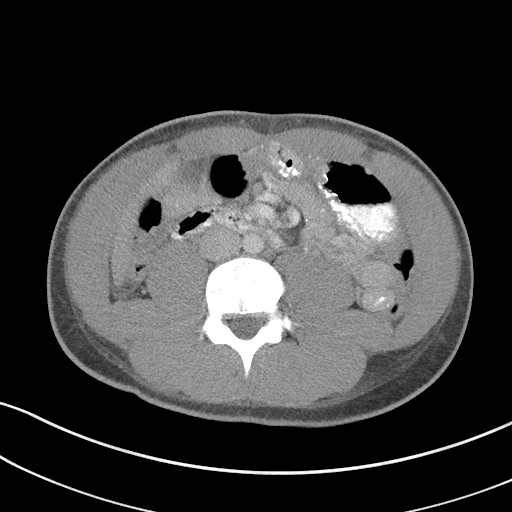
[im 64/92  soft-tissue]
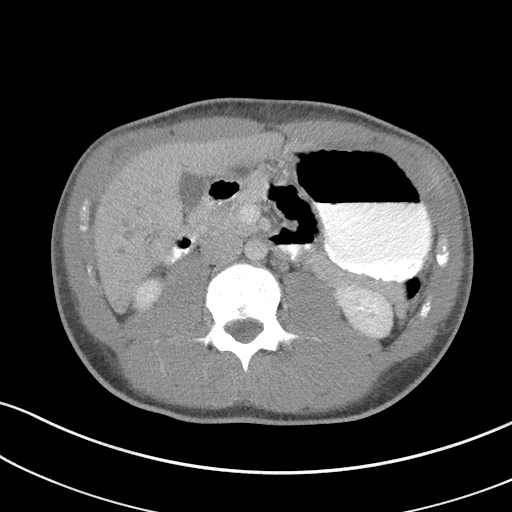
[im 64/92  bone]
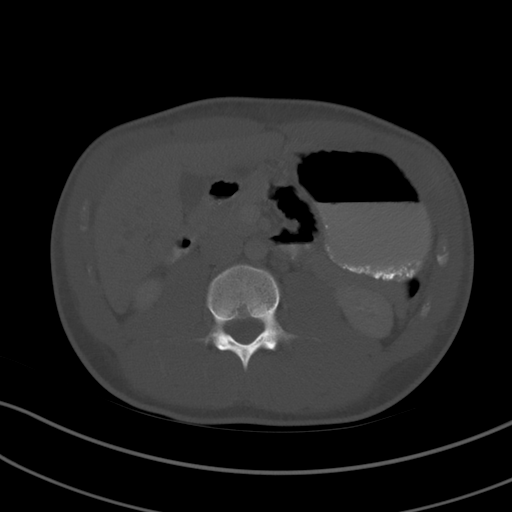
[im 71/92  soft-tissue]
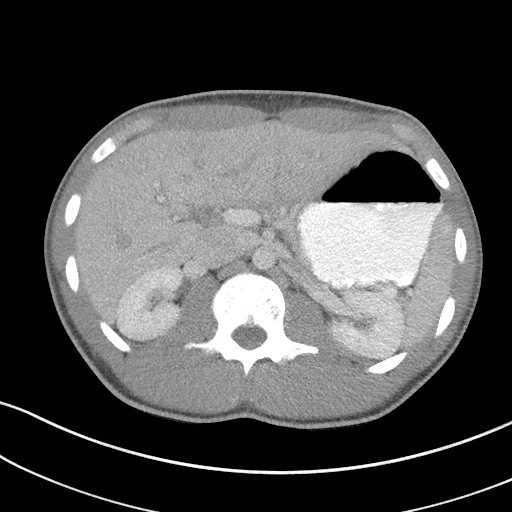
[im 78/92  soft-tissue]
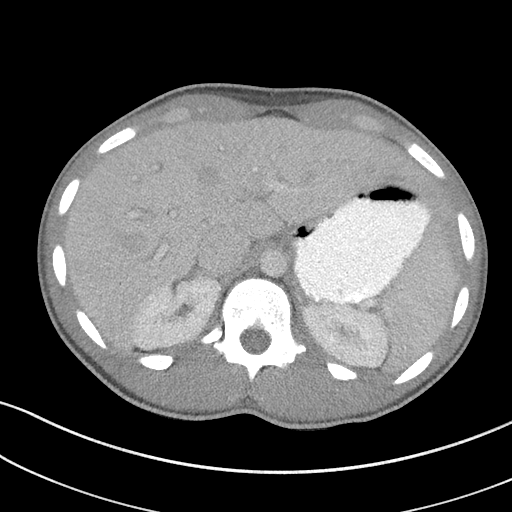
[im 85/92  soft-tissue]
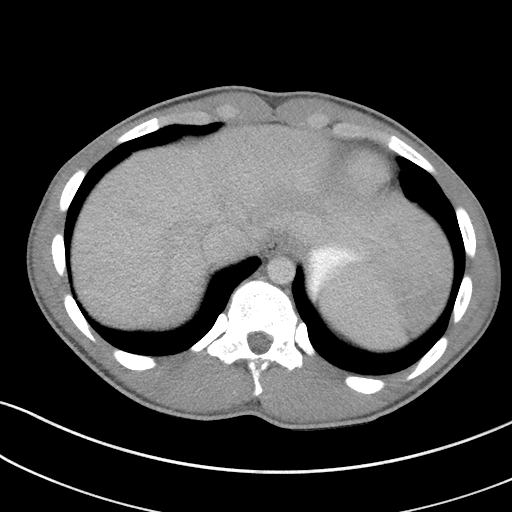

[Series 5: coronal st · coronal · 0.74mm/px · 3 of 74 slices shown]
[im 25/74  soft-tissue]
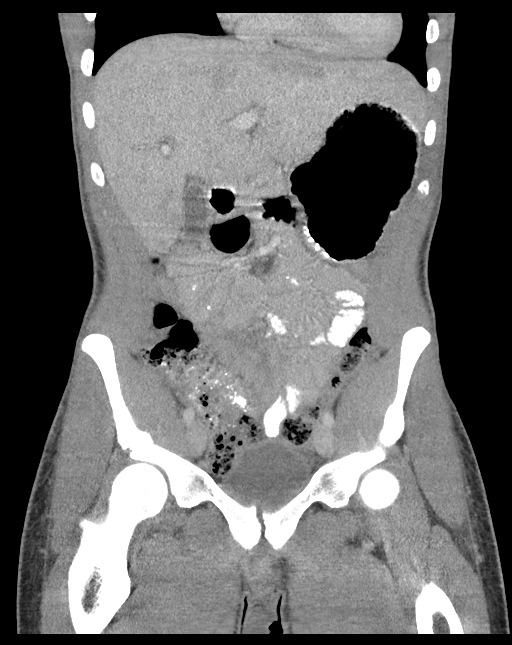
[im 33/74  soft-tissue]
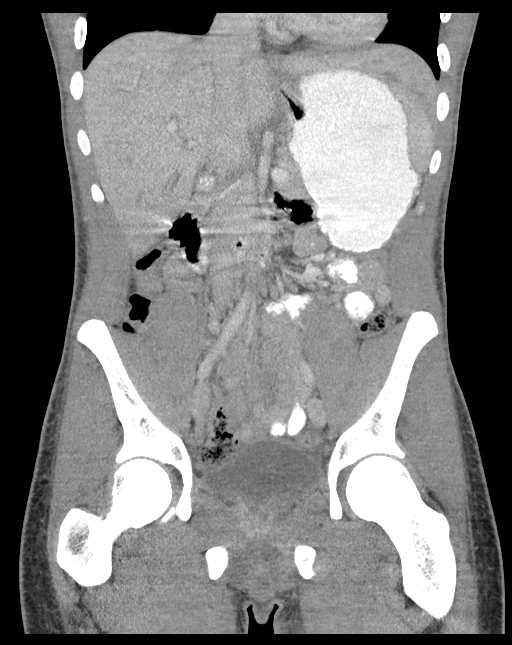
[im 41/74  soft-tissue]
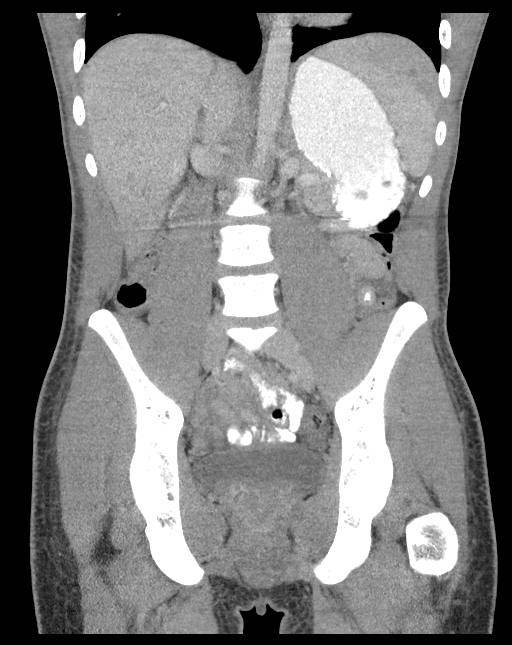

[15 of 46 positions shown; findings below may reference images not displayed]

FINDINGS: Lower chest: Limited visualization of the lower thorax is negative
for focal airspace opacity or pleural effusion.

Normal heart size.  No pericardial effusion.

Hepatobiliary: Normal hepatic contour. Suspected minimal amount of
periportal edema. No discrete hepatic lesions. Normal appearance of
the gallbladder given degree distention. No radiopaque gallstones.
No ascites.

Pancreas: Normal appearance of the pancreas

Spleen: Normal appearance of the spleen.

Adrenals/Urinary Tract: Normal appearance of the bilateral kidneys.
No definite renal stones this postcontrast examination. No discrete
renal lesions. No urine obstruction or perinephric stranding.

Normal appearance of the bilateral adrenal glands.

Normal appearance of the urinary bladder given degree distention.
Punctate phleboliths are seen with the lower pelvis bilaterally.

Stomach/Bowel: Moderate colonic stool burden without evidence of
enteric obstruction. The cecum is in normally located within the
right lower abdominal quadrant. The appendix is not visualized,
however there is no definitive pericecal stranding. No evidence of
enteric obstruction. No pneumoperitoneum, pneumatosis or portal
venous gas.

Vascular/Lymphatic: Normal caliber of the abdominal aorta. The major
branch vessels of the abdominal aorta appear patent on this non CTA
examination. Normal orientation of the SMA and SMV.

Reproductive: Normal appearance of the prostate. Small amount of
fluid is seen within the pelvic cul-de-sac (image 65, series 2).

Other: Mild diffuse body wall anasarca.

Musculoskeletal: No acute or aggressive osseous abnormalities. Right
L5 pars defects without associated anterolisthesis.
IMPRESSION: 1. Potential minimal amount of periportal edema, nonspecific though
could be seen in the setting of a hepatitis. Correlation with LFTs
is recommended. Normal appearance of the gallbladder. No radiopaque
gallstones.
2. Small amount of nonspecific free fluid within the pelvic
cul-de-sac, the etiology of which is not depicted on this
examination.
3. Nonvisualization of the appendix, however there is no definitive
pericecal inflammatory change.
4. Incidentally noted right L5 pars defects without associated
anterolisthesis.

## 2018-05-18 ENCOUNTER — Encounter: Payer: Self-pay | Admitting: Gynecology

## 2018-05-18 ENCOUNTER — Ambulatory Visit
Admission: EM | Admit: 2018-05-18 | Discharge: 2018-05-18 | Disposition: A | Discharge status: Home / Self Care | Payer: BLUE CROSS/BLUE SHIELD | Attending: Family Medicine | Admitting: Family Medicine

## 2018-05-18 ENCOUNTER — Other Ambulatory Visit: Payer: Self-pay

## 2018-05-18 DIAGNOSIS — A749 Chlamydial infection, unspecified: Secondary | ICD-10-CM

## 2018-05-18 DIAGNOSIS — Z202 Contact with and (suspected) exposure to infections with a predominantly sexual mode of transmission: Secondary | ICD-10-CM

## 2018-05-18 LAB — CHLAMYDIA/NGC RT PCR (ARMC ONLY)
CHLAMYDIA TR: DETECTED — AB
N GONORRHOEAE: NOT DETECTED

## 2018-05-18 MED ORDER — AZITHROMYCIN 500 MG PO TABS
1000.0000 mg | ORAL_TABLET | Freq: Once | ORAL | Status: AC
  Administered 2018-05-18: 1000 mg via ORAL

## 2018-05-18 NOTE — ED Provider Notes (Signed)
[facility]    CSN: [id number] Arrival date & time: 05/18/18  1200  History   Chief Complaint Chief Complaint  Patient presents with  . SEXUALLY TRANSMITTED DISEASE   HPI  23 year old male presents with concern for STD.  Patient reports ongoing penile discharge.  He states that it has been going on for approximately 1 month.  He states that it is mild.  He has had unprotected sex with his now ex-partner who has been diagnosed with chlamydia.  He is concerned about STD.  Abdominal pain.  No fever.  No other associated symptoms.  Patient also states that he has a history of scoliosis.  He states that he has occasional pain and popping of his upper thoracic spine.  He states that he would like to be examined regarding his back today.  PMH, Surgical Hx, Family Hx, Social History reviewed and updated as below.  Past Medical History:  Diagnosis Date  . Acne   . Appendicitis   . Obesity   . Scoliosis     Patient Active Problem List   Diagnosis Date Noted  . Appendicitis 09/28/2017  . Acute appendicitis   . Chlamydia infection 12/25/2016  . Midline thoracic back pain 07/05/2014  . Spinal asymmetry (< 10 degrees) 07/05/2014    Past Surgical History:  Procedure Laterality Date  . LAPAROSCOPIC APPENDECTOMY N/A 09/28/2017   Procedure: APPENDECTOMY LAPAROSCOPIC;  Surgeon: Leafy Ro, MD;  Location: [facility] ORS;  Service: General;  Laterality: N/A;   Home Medications    Prior to Admission medications   Not on File    Family History Family History  Problem Relation Age of Onset  . Allergic rhinitis Sister     Social History Social History   Tobacco Use  . Smoking status: Never Smoker  . Smokeless tobacco: Never Used  Substance Use Topics  . Alcohol use: Yes    Comment: occasional  . Drug use: Yes    Types: Marijuana    Comment: 3-4 times daily     Allergies   Peanut oil   Review of Systems Review of Systems  Genitourinary: Positive for  discharge.  Musculoskeletal: Positive for back pain.   Physical Exam Triage Vital Signs ED Triage Vitals  Enc Vitals Group     BP 05/18/18 1214 134/84     Pulse Rate 05/18/18 1214 81     Resp 05/18/18 1214 16     Temp 05/18/18 1214 98.1 F (36.7 C)     Temp Source 05/18/18 1214 Oral     SpO2 05/18/18 1214 100 %     Weight 05/18/18 1217 170 lb (77.1 kg)     Height 05/18/18 1217 6\' 2"  (1.88 m)     Head Circumference --      Peak Flow --      Pain Score 05/18/18 1213 0     Pain Loc --      Pain Edu? --      Excl. in GC? --    Updated Vital Signs BP 134/84 (BP Location: Left Arm)   Pulse 81   Temp 98.1 F (36.7 C) (Oral)   Resp 16   Ht 6\' 2"  (1.88 m)   Wt 77.1 kg   SpO2 100%   BMI 21.83 kg/m   Visual Acuity Right Eye Distance:   Left Eye Distance:   Bilateral Distance:    Right Eye Near:   Left Eye Near:    Bilateral Near:  Physical Exam Vitals signs and nursing note reviewed.  Constitutional:      General: He is not in acute distress. HENT:     Head: Normocephalic and atraumatic.  Eyes:     Conjunctiva/sclera: Conjunctivae normal.  Cardiovascular:     Rate and Rhythm: Normal rate and regular rhythm.  Pulmonary:     Effort: Pulmonary effort is normal.     Breath sounds: Normal breath sounds.  Genitourinary:    Penis: Normal.      Scrotum/Testes: Normal.  Musculoskeletal:     Thoracic back: He exhibits no deformity and no spasm.     Comments: No apparent scoliosis.  Neurological:     Mental Status: He is alert.  Psychiatric:        Mood and Affect: Mood normal.        Behavior: Behavior normal.    UC Treatments / Results  Labs (all labs ordered are listed, but only abnormal results are displayed) Labs Reviewed  CHLAMYDIA/NGC RT PCR [facility] ONLY)    EKG None  Radiology No results found.  Procedures Procedures (including critical care time)  Medications Ordered in UC Medications  azithromycin (ZITHROMAX) tablet 1,000 mg  (1,000 mg Oral Given 05/18/18 1318)    Initial Impression / Assessment and Plan / UC Course  I have reviewed the triage vital signs and the nursing notes.  Pertinent labs & imaging results that were available during my care of the patient were reviewed by me and considered in my medical decision making (see chart for details).    23 year old male presents with exposure.  Patient desires empiric treatment for chlamydia while awaiting urine results.  Azithromycin given.  Patient declined IM Rocephin.  Regarding his prior scoliosis, this seems to have resolved.  There is no evidence of scoliosis or abnormalities on his exam.  Final Clinical Impressions(s) / UC Diagnoses   Final diagnoses:  STD exposure     Discharge Instructions     We will call with the results.  Safe sex.  No evidence of scoliosis.  Take care  Dr. Adriana Simasook    ED Prescriptions    None     Controlled Substance Prescriptions [place] Controlled Substance Registry consulted? Not Applicable   Tommie SamsCook, Amaira Safley G, DO 05/18/18 1356

## 2018-05-18 NOTE — ED Triage Notes (Signed)
Per patient girlfriend with  Diagnose of Chlamydia Per patient notice discharge

## 2018-05-18 NOTE — Discharge Instructions (Signed)
We will call with the results.  Safe sex.  No evidence of scoliosis.  Take care  Dr. Adriana Simas

## 2018-05-21 ENCOUNTER — Telehealth (HOSPITAL_COMMUNITY): Payer: Self-pay | Admitting: Emergency Medicine

## 2018-05-21 NOTE — Telephone Encounter (Signed)
Chlamydia is positive.  This was treated at the urgent care visit with po zithromax 1g.  Pt needs education to please refrain from sexual intercourse for 7 days to give the medicine time to work.  Sexual partners need to be notified and tested/treated.  Condoms may reduce risk of reinfection.  Recheck or followup with PCP for further evaluation if symptoms are not improving.  [facility] notified.  Patient contacted and made aware, all questions answered.      

## 2018-09-15 IMAGING — CT CT ABD-PELV W/ CM
2 of 4 series · 15 of 46 positions shown, 17 images · IV contrast (iopamidol)
Comparison: 12/05/2016

CLINICAL DATA: 21-year-old male with a history of right lower
quadrant pain for 2 days

EXAM:
CT ABDOMEN AND PELVIS WITH CONTRAST
TECHNIQUE: Multidetector CT imaging of the abdomen and pelvis was performed
using the standard protocol following bolus administration of
intravenous contrast.
CONTRAST:  100mL [id number] IOPAMIDOL ([id number]-[id number]) INJECTION 61%

[Series 2: routine abd/pel with · axial · 0.64mm/px · z∈[-488,-113]mm · 12 of 86 slices shown, 14 images]
[im 7/86  soft-tissue]
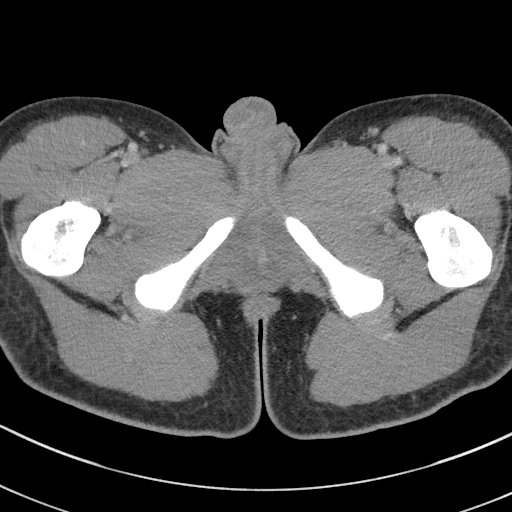
[im 7/86  bone]
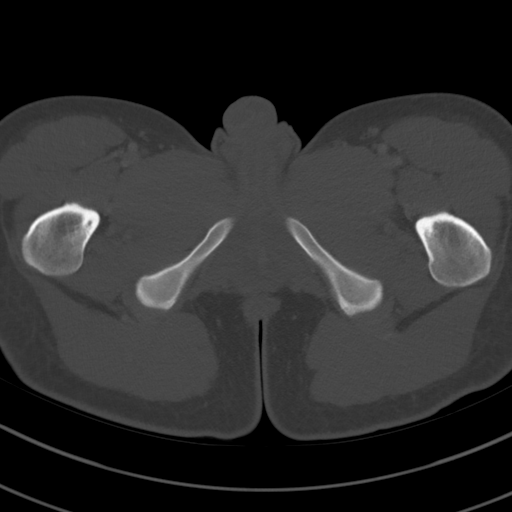
[im 14/86  soft-tissue]
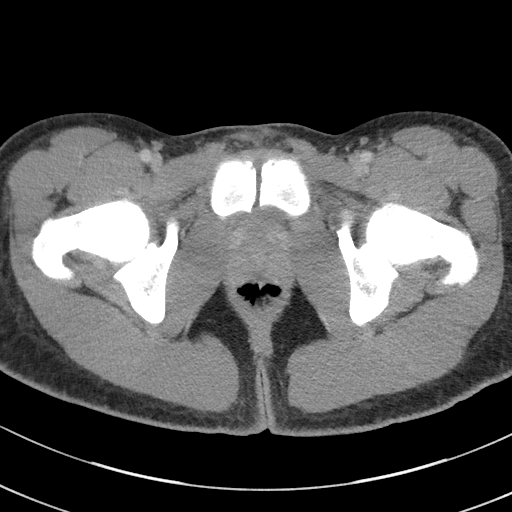
[im 21/86  soft-tissue]
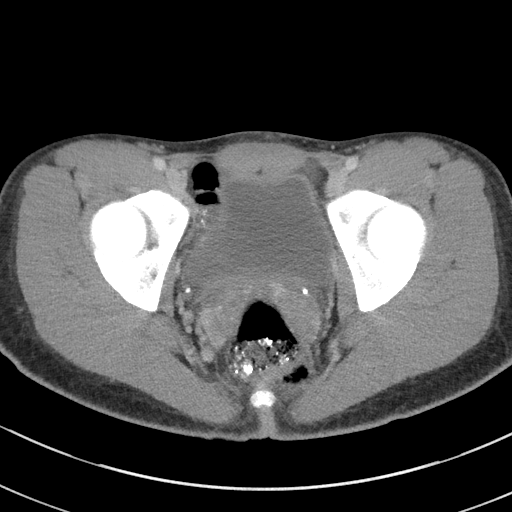
[im 28/86  soft-tissue]
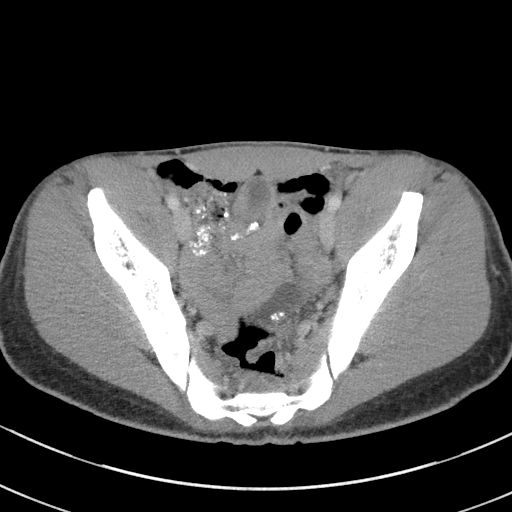
[im 35/86  soft-tissue]
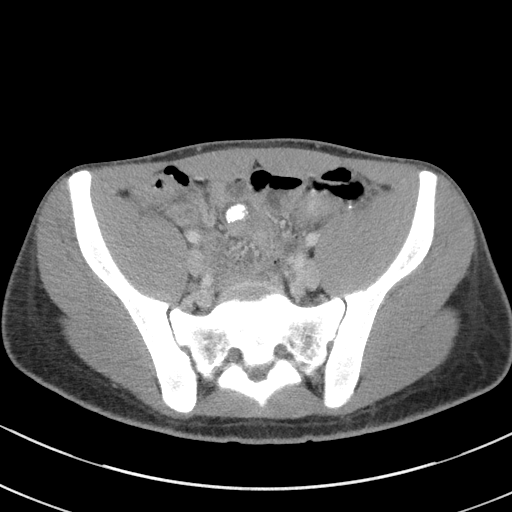
[im 41/86  soft-tissue]
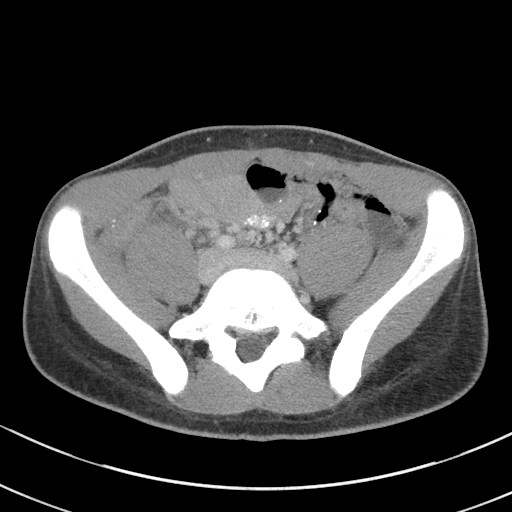
[im 48/86  soft-tissue]
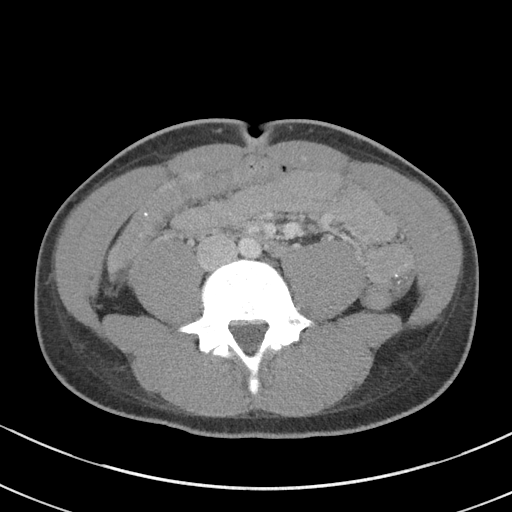
[im 55/86  soft-tissue]
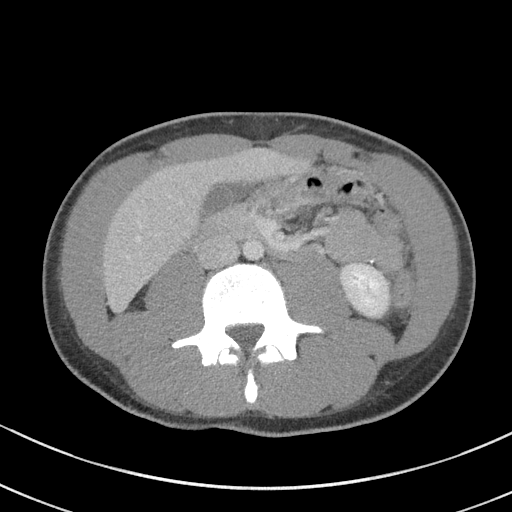
[im 62/86  soft-tissue]
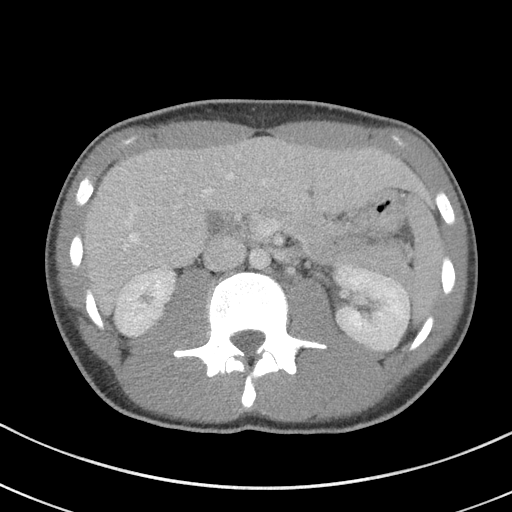
[im 62/86  bone]
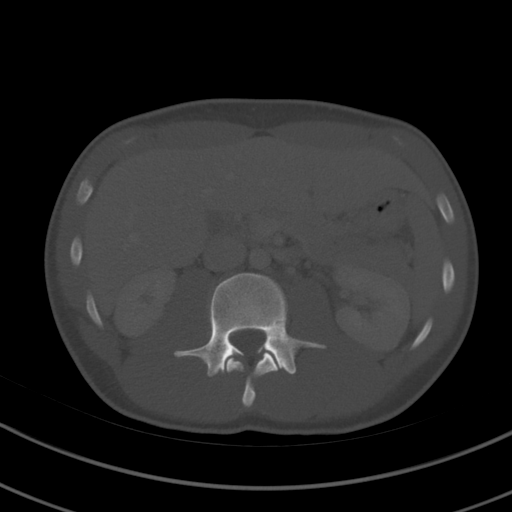
[im 69/86  soft-tissue]
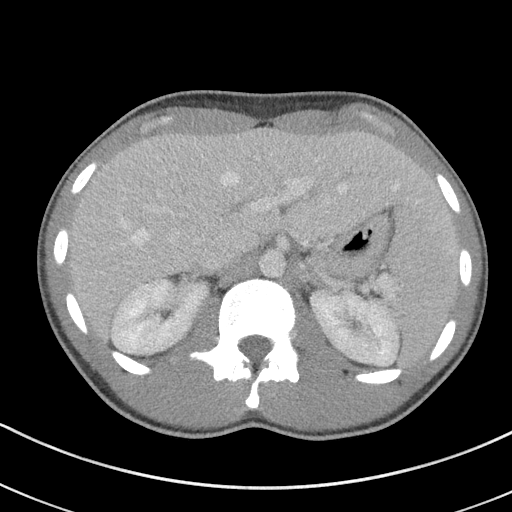
[im 75/86  soft-tissue]
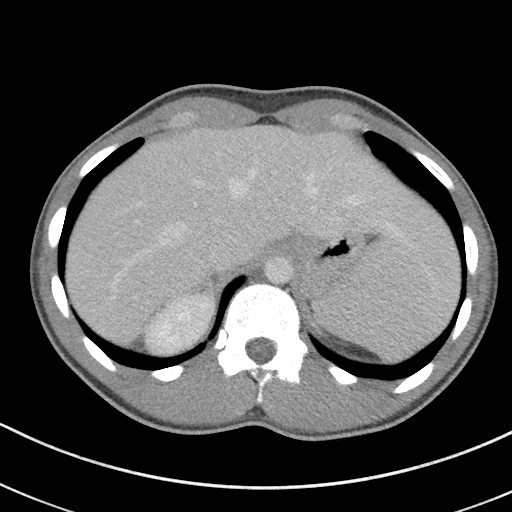
[im 82/86  soft-tissue]
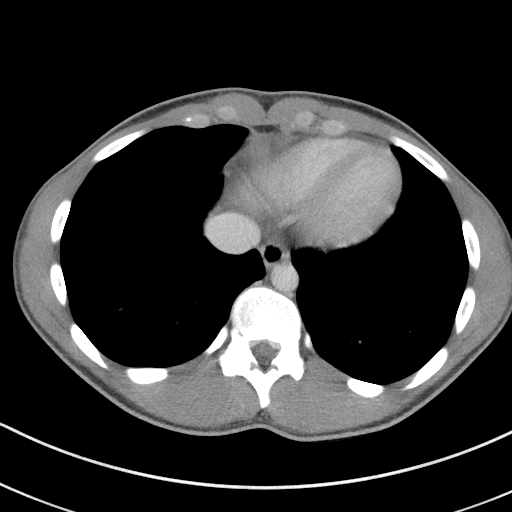

[Series 5: coronal st · coronal · 0.62mm/px · 3 of 79 slices shown]
[im 27/79  soft-tissue]
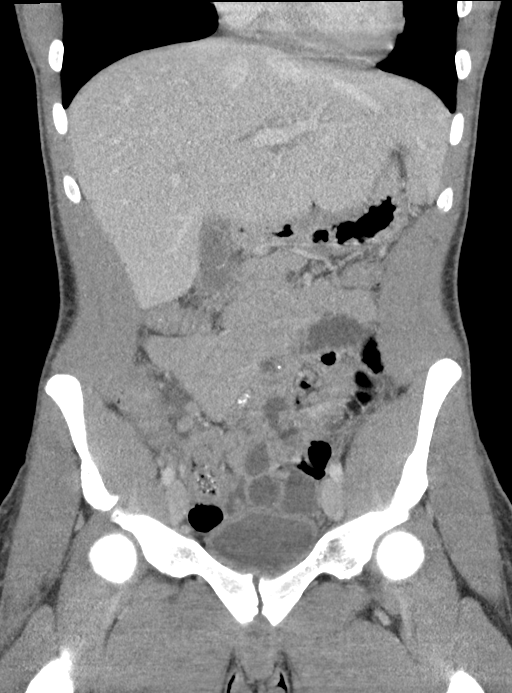
[im 35/79  soft-tissue]
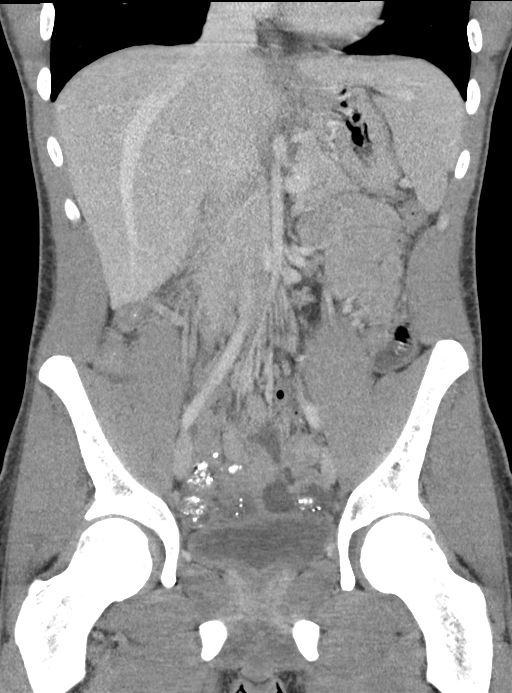
[im 44/79  soft-tissue]
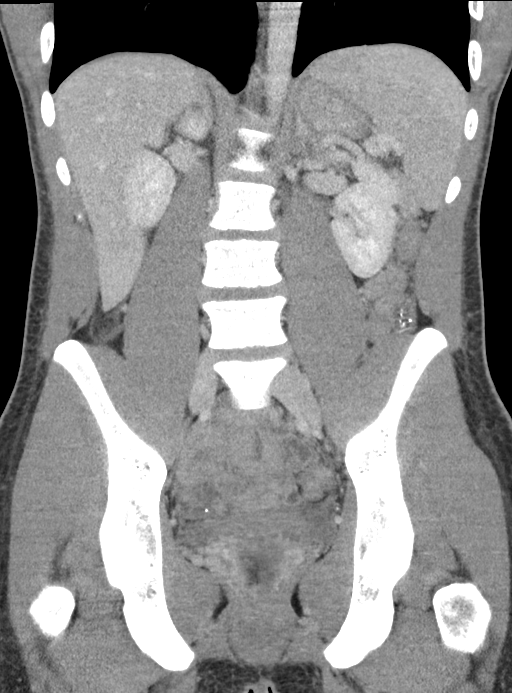

[15 of 46 positions shown; findings below may reference images not displayed]

FINDINGS: Lower chest: No acute abnormality.

Hepatobiliary: No focal liver abnormality is seen. No gallstones,
gallbladder wall thickening, or biliary dilatation.

Pancreas: Unremarkable pancreas

Spleen: Unremarkable spleen

Adrenals/Urinary Tract: Unremarkable appearance of the adrenal
glands. No evidence of hydronephrosis of the right or left kidney.
No nephrolithiasis. Unremarkable course of the bilateral ureters.
Unremarkable appearance of the urinary bladder.

Stomach/Bowel: Unremarkable appearance of stomach. Unremarkable
small bowel. No abnormal distention. No transition point.

Colon is relatively decompressed with no transition point.

There is a tubular dilated structure in the right lower quadrant
overlying the psoas muscle adjacent to cecum measuring 12 mm,
favored to represent appendix. Mild inflammatory changes adjacent to
the structure, with no evidence of abscess or extraluminal gas. The
course of this structure is not completely visualized given the
inflammatory changes and the low intra-abdominal fat content. Normal
appendix appears present in this location on the comparison CT scan.

Vascular/Lymphatic: No significant atherosclerotic changes. No
adenopathy.

Reproductive: Unremarkable pelvic structures

Other: There is trace free fluid layer dependently in the pelvis.

Musculoskeletal: No acute or significant osseous findings.
IMPRESSION: Dilated tubular structure in the right lower quadrant with local
inflammatory changes. The entire course of this tubular structure is
not visualized, though is favored to represent the appendix and
appendicitis. Surgical consultation is recommended.

Reactive free fluid within the dependent pelvis.

These results were called by telephone at the time of interpretation
on 09/28/2017 at [DATE] to Dr. RAQUEL MADANI , who verbally
acknowledged these results.

## 2020-01-17 ENCOUNTER — Encounter: Payer: Self-pay | Admitting: Emergency Medicine

## 2020-01-17 ENCOUNTER — Ambulatory Visit
Admission: EM | Admit: 2020-01-17 | Discharge: 2020-01-17 | Disposition: A | Discharge status: Home / Self Care | Payer: 59 | Attending: Internal Medicine | Admitting: Internal Medicine

## 2020-01-17 ENCOUNTER — Other Ambulatory Visit: Payer: Self-pay

## 2020-01-17 DIAGNOSIS — R21 Rash and other nonspecific skin eruption: Secondary | ICD-10-CM

## 2020-01-17 LAB — CBC WITH DIFFERENTIAL/PLATELET
Abs Immature Granulocytes: 0.02 K/uL (ref 0.00–0.07)
Basophils Absolute: 0.1 K/uL (ref 0.0–0.1)
Basophils Relative: 2 %
Eosinophils Absolute: 0.5 K/uL (ref 0.0–0.5)
Eosinophils Relative: 7 %
HCT: 47.8 % (ref 39.0–52.0)
Hemoglobin: 15.8 g/dL (ref 13.0–17.0)
Immature Granulocytes: 0 %
Lymphocytes Relative: 22 %
Lymphs Abs: 1.3 K/uL (ref 0.7–4.0)
MCH: 28.7 pg (ref 26.0–34.0)
MCHC: 33.1 g/dL (ref 30.0–36.0)
MCV: 86.9 fL (ref 80.0–100.0)
Monocytes Absolute: 0.7 K/uL (ref 0.1–1.0)
Monocytes Relative: 12 %
Neutro Abs: 3.5 K/uL (ref 1.7–7.7)
Neutrophils Relative %: 57 %
Platelets: 269 K/uL (ref 150–400)
RBC: 5.5 MIL/uL (ref 4.22–5.81)
RDW: 13.6 % (ref 11.5–15.5)
WBC: 6.2 K/uL (ref 4.0–10.5)
nRBC: 0 % (ref 0.0–0.2)

## 2020-01-17 MED ORDER — PREDNISONE 20 MG PO TABS: 20.0000 mg | ORAL_TABLET | Freq: Every day | ORAL | 0 refills | Status: AC

## 2020-01-17 MED ORDER — HYDROXYZINE HCL 25 MG PO TABS: 25.0000 mg | ORAL_TABLET | Freq: Three times a day (TID) | ORAL | 0 refills | Status: DC | PRN

## 2020-01-17 MED ORDER — DOXYCYCLINE HYCLATE 100 MG PO CAPS: 100.0000 mg | ORAL_CAPSULE | Freq: Two times a day (BID) | ORAL | 0 refills | Status: DC

## 2020-01-17 NOTE — ED Triage Notes (Signed)
Patient c/o itchy bumps that started is all over his body that started yesterday morning.

## 2020-01-18 LAB — RPR: RPR Ser Ql: NONREACTIVE

## 2020-01-18 NOTE — ED Provider Notes (Signed)
[facility]    CSN: [id number] Arrival date & time: 01/17/20  7353      History   Chief Complaint Chief Complaint  Patient presents with  . Rash    HPI Jonathan Peterson. is a 24 y.o. male comes to the urgent care with generalized itchy rash which started yesterday.  Patient denies any recent travel.  He spent the night in his friend's house.  No exposure to bedbugs.  Patient denies any generalized body aches, fever or chills.  No nausea or vomiting.   HPI  Past Medical History:  Diagnosis Date  . Acne   . Appendicitis   . Obesity   . Scoliosis     Patient Active Problem List   Diagnosis Date Noted  . Appendicitis 09/28/2017  . Acute appendicitis   . Chlamydia infection 12/25/2016  . Midline thoracic back pain 07/05/2014  . Spinal asymmetry (< 10 degrees) 07/05/2014    Past Surgical History:  Procedure Laterality Date  . LAPAROSCOPIC APPENDECTOMY N/A 09/28/2017   Procedure: APPENDECTOMY LAPAROSCOPIC;  Surgeon: Leafy Ro, MD;  Location: [facility] ORS;  Service: General;  Laterality: N/A;       Home Medications    Prior to Admission medications   Medication Sig Start Date End Date Taking? Authorizing Provider  doxycycline (VIBRAMYCIN) 100 MG capsule Take 1 capsule (100 mg total) by mouth 2 (two) times daily. 01/17/20   Merrilee Jansky, MD  hydrOXYzine (ATARAX/VISTARIL) 25 MG tablet Take 1 tablet (25 mg total) by mouth every 8 (eight) hours as needed. 01/17/20   Michaiah Holsopple, Britta Mccreedy, MD  predniSONE (DELTASONE) 20 MG tablet Take 1 tablet (20 mg total) by mouth daily for 5 days. 01/17/20 01/22/20  Merrilee Jansky, MD    Family History Family History  Problem Relation Age of Onset  . Allergic rhinitis Sister     Social History Social History   Tobacco Use  . Smoking status: Never Smoker  . Smokeless tobacco: Never Used  Vaping Use  . Vaping Use: Never used  Substance Use Topics  . Alcohol use: Yes    Comment: occasional  . Drug use: Yes     Types: Marijuana    Comment: 3-4 times daily     Allergies   Peanut oil   Review of Systems Review of Systems  Constitutional: Negative.   HENT: Negative.   Gastrointestinal: Negative.   Musculoskeletal: Negative.   Skin: Positive for rash. Negative for color change, pallor and wound.     Physical Exam Triage Vital Signs ED Triage Vitals  Enc Vitals Group     BP 01/17/20 1100 135/88     Pulse Rate 01/17/20 1100 70     Resp 01/17/20 1100 16     Temp 01/17/20 1100 98.5 F (36.9 C)     Temp Source 01/17/20 1100 Oral     SpO2 01/17/20 1100 100 %     Weight 01/17/20 1058 170 lb (77.1 kg)     Height 01/17/20 1058 6\' 2"  (1.88 m)     Head Circumference --      Peak Flow --      Pain Score 01/17/20 1058 0     Pain Loc --      Pain Edu? --      Excl. in GC? --    No data found.  Updated Vital Signs BP 135/88 (BP Location: Left Arm)   Pulse 70   Temp 98.5 F (36.9 C) (Oral)  Resp 16   Ht 6\' 2"  (1.88 m)   Wt 77.1 kg   SpO2 100%   BMI 21.83 kg/m   Visual Acuity Right Eye Distance:   Left Eye Distance:   Bilateral Distance:    Right Eye Near:   Left Eye Near:    Bilateral Near:     Physical Exam Vitals and nursing note reviewed.  Constitutional:      General: He is not in acute distress.    Appearance: He is not ill-appearing or diaphoretic.  Cardiovascular:     Rate and Rhythm: Normal rate and regular rhythm.  Pulmonary:     Effort: Pulmonary effort is normal.     Breath sounds: Normal breath sounds.  Musculoskeletal:        General: No tenderness, deformity or signs of injury. Normal range of motion.  Skin:    General: Skin is warm.     Capillary Refill: Capillary refill takes less than 2 seconds.     Coloration: Skin is not pale.     Findings: Rash present. No erythema.     Comments: Papular rash without erythema over the extremities and upper back as well as face.  No palms fo hands or soles of feet involvement.  Neurological:     Mental Status:  He is alert.      UC Treatments / Results  Labs (all labs ordered are listed, but only abnormal results are displayed) Labs Reviewed  RPR  CBC WITH DIFFERENTIAL/PLATELET    EKG   Radiology No results found.  Procedures Procedures (including critical care time)  Medications Ordered in UC Medications - No data to display  Initial Impression / Assessment and Plan / UC Course  I have reviewed the triage vital signs and the nursing notes.  Pertinent labs & imaging results that were available during my care of the patient were reviewed by me and considered in my medical decision making (see chart for details).     1.  Generalized rash: RPR Hydroxyzine every 6 hours as needed Doxycycline 1 tablet twice daily for 5 days Prednisone 20 mg orally daily Return precautions given Final Clinical Impressions(s) / UC Diagnoses   Final diagnoses:  Rash and nonspecific skin eruption   Discharge Instructions   None    ED Prescriptions    Medication Sig Dispense Auth. Provider   hydrOXYzine (ATARAX/VISTARIL) 25 MG tablet Take 1 tablet (25 mg total) by mouth every 8 (eight) hours as needed. 30 tablet Ermie Glendenning, , MD   doxycycline (VIBRAMYCIN) 100 MG capsule Take 1 capsule (100 mg total) by mouth 2 (two) times daily. 20 capsule Jaydalyn Demattia, Britta Mccreedy, MD   predniSONE (DELTASONE) 20 MG tablet Take 1 tablet (20 mg total) by mouth daily for 5 days. 5 tablet Earon Rivest, Britta Mccreedy, MD     PDMP not reviewed this encounter.   Britta Mccreedy, MD 01/18/20 1329

## 2020-03-26 ENCOUNTER — Ambulatory Visit
Admission: EM | Admit: 2020-03-26 | Discharge: 2020-03-26 | Disposition: A | Discharge status: Home / Self Care | Payer: 59 | Attending: Physician Assistant | Admitting: Physician Assistant

## 2020-03-26 ENCOUNTER — Encounter: Payer: Self-pay | Admitting: Emergency Medicine

## 2020-03-26 ENCOUNTER — Other Ambulatory Visit: Payer: Self-pay

## 2020-03-26 DIAGNOSIS — W57XXXA Bitten or stung by nonvenomous insect and other nonvenomous arthropods, initial encounter: Secondary | ICD-10-CM | POA: Diagnosis not present

## 2020-03-26 DIAGNOSIS — S0086XA Insect bite (nonvenomous) of other part of head, initial encounter: Secondary | ICD-10-CM | POA: Diagnosis not present

## 2020-03-26 DIAGNOSIS — H02846 Edema of left eye, unspecified eyelid: Secondary | ICD-10-CM | POA: Diagnosis not present

## 2020-03-26 MED ORDER — DOXYCYCLINE HYCLATE 100 MG PO CAPS: 100.0000 mg | ORAL_CAPSULE | Freq: Two times a day (BID) | ORAL | 0 refills | Status: AC

## 2020-03-26 MED ORDER — PREDNISONE 10 MG PO TABS: ORAL_TABLET | ORAL | 0 refills | Status: DC

## 2020-03-26 NOTE — Discharge Instructions (Signed)
Continue the medications that you are using (Benadryl and antihistamine eyedrops).  Add prednisone to this which will further help to decrease the swelling if this is due to allergy which we suspect.  Continue cool compresses.  If for some reason the condition is looking better and seems to be getting worse, you develop fever, you develop tenderness or pain of the areas, start the antibiotics and take full course as prescribed.  Anything is severely worse please return to our office or go to the emergency department.

## 2020-03-26 NOTE — ED Provider Notes (Signed)
[facility]    CSN: [id number] Arrival date & time: 03/26/20  0857      History   Chief Complaint Chief Complaint  Patient presents with  . Facial Swelling    left eye    HPI Jonathan Peterson. is a 24 y.o. male presenting for 3 to 4-day history of an erythematous bump on the left side of his nose with associated swelling of the left upper eyelid.  States that he is not sure why he has had this swelling.  Does not remember getting stung by an insect or bug of any sort.  He states that he just randomly noticed it 1 day and it seemed to progressively get worse.  He states that initially he did have a lot of swelling under the eye as well and that improved with cool compresses, Benadryl and antihistamine eyedrops.  He denies any associated fever.  He says that the eyelid and the erythematous bumps are not painful to touch.  He denies any known allergies besides peanut oil which she has not come to contact with.  He has a history of acne and states that he thought the bump was just a pimple at first.  Patient states that he had some congestion earlier in the week and took Sudafed capsules about the time the swelling started so he thought he could have been having some sort of allergy to that.  He denies any continued congestion and has not taken Sudafed in several days.  He never had a cough, sore throat or any other upper respiratory symptoms.  He has no other complaints or concerns today.  HPI  Past Medical History:  Diagnosis Date  . Acne   . Appendicitis   . Obesity   . Scoliosis     Patient Active Problem List   Diagnosis Date Noted  . Appendicitis 09/28/2017  . Acute appendicitis   . Chlamydia infection 12/25/2016  . Midline thoracic back pain 07/05/2014  . Spinal asymmetry (< 10 degrees) 07/05/2014    Past Surgical History:  Procedure Laterality Date  . APPENDECTOMY    . LAPAROSCOPIC APPENDECTOMY N/A 09/28/2017   Procedure: APPENDECTOMY LAPAROSCOPIC;   Surgeon: Leafy Ro, MD;  Location: [facility] ORS;  Service: General;  Laterality: N/A;       Home Medications    Prior to Admission medications   Medication Sig Start Date End Date Taking? Authorizing Provider  doxycycline (VIBRAMYCIN) 100 MG capsule Take 1 capsule (100 mg total) by mouth 2 (two) times daily for 7 days. 03/26/20 04/02/20  Eusebio Friendly B, PA-C  hydrOXYzine (ATARAX/VISTARIL) 25 MG tablet Take 1 tablet (25 mg total) by mouth every 8 (eight) hours as needed. 01/17/20   Merrilee Jansky, MD  predniSONE (DELTASONE) 10 MG tablet Take 5 tablets by mouth on the first day and decrease by 1 tablet for the next 4 days 03/26/20   Gareth Morgan    Family History Family History  Problem Relation Age of Onset  . Allergic rhinitis Sister   . Anemia Mother   . Hypertension Mother   . Healthy Father     Social History Social History   Tobacco Use  . Smoking status: Current Some Day Smoker    Types: Cigars  . Smokeless tobacco: Never Used  . Tobacco comment: occassional  Vaping Use  . Vaping Use: Never used  Substance Use Topics  . Alcohol use: Yes    Comment: occasional  . Drug use: Yes  Types: Marijuana    Comment: 3-4 times daily     Allergies   Peanut oil   Review of Systems Review of Systems  Constitutional: Negative for fatigue and fever.  HENT: Negative for congestion and rhinorrhea.   Respiratory: Negative for cough.   Gastrointestinal: Negative for nausea and vomiting.  Musculoskeletal: Negative for arthralgias and myalgias.  Skin: Positive for color change. Negative for wound.  Neurological: Negative for dizziness, weakness, numbness and headaches.     Physical Exam Triage Vital Signs ED Triage Vitals [03/26/20 0913]  Enc Vitals Group     BP (!) 141/87     Pulse Rate 77     Resp 18     Temp 98.1 F (36.7 C)     Temp Source Oral     SpO2 100 %     Weight 175 lb (79.4 kg)     Height 6\' 2"  (1.88 m)     Head Circumference      Peak  Flow      Pain Score 0     Pain Loc      Pain Edu?      Excl. in GC?    No data found.  Updated Vital Signs BP (!) 141/87 (BP Location: Left Arm)   Pulse 77   Temp 98.1 F (36.7 C) (Oral)   Resp 18   Ht 6\' 2"  (1.88 m)   Wt 175 lb (79.4 kg)   SpO2 100%   BMI 22.47 kg/m   Visual Acuity Right Eye Distance: 20/30 Left Eye Distance: 20/25 Bilateral Distance: 20/20  Right Eye Near:   Left Eye Near:    Bilateral Near:     Physical Exam Vitals and nursing note reviewed.  Constitutional:      General: He is not in acute distress.    Appearance: Normal appearance. He is well-developed. He is not ill-appearing or toxic-appearing.  HENT:     Head: Normocephalic and atraumatic.     Nose: Nose normal. No congestion.     Mouth/Throat:     Mouth: Mucous membranes are moist.     Pharynx: Oropharynx is clear.  Eyes:     General: Vision grossly intact. No scleral icterus.       Left eye: No foreign body, discharge or hordeolum.     Conjunctiva/sclera: Conjunctivae normal.     Left eye: Left conjunctiva is not injected.     Comments: There is mild to moderate swelling of the upper left eyelid with associated erythema.  Area is nontender to palpation.  To the left side of the nose there is an erythematous and soft papule which appears to have a central pustule.  This also is nontender.  See images below.  Cardiovascular:     Rate and Rhythm: Normal rate and regular rhythm.  Pulmonary:     Effort: Pulmonary effort is normal. No respiratory distress.  Musculoskeletal:     Cervical back: Neck supple.  Skin:    General: Skin is warm and dry.  Neurological:     General: No focal deficit present.     Mental Status: He is alert. Mental status is at baseline.     Motor: No weakness.     Gait: Gait normal.  Psychiatric:        Mood and Affect: Mood normal.        Behavior: Behavior normal.        Thought Content: Thought content normal.  UC Treatments / Results   Labs (all labs ordered are listed, but only abnormal results are displayed) Labs Reviewed - No data to display  EKG   Radiology No results found.  Procedures Procedures (including critical care time)  Medications Ordered in UC Medications - No data to display  Initial Impression / Assessment and Plan / UC Course  I have reviewed the triage vital signs and the nursing notes.  Pertinent labs & imaging results that were available during my care of the patient were reviewed by me and considered in my medical decision making (see chart for details).   Patient exam is most consistent with some sort of insect bite or sting and secondary localized allergic reaction.  Advised him to continue the treatment he has been doing and also add prednisone which will further decrease the swelling hopefully.  I did provide him with these prescription for doxycycline just in case any swelling gets worse or he develops any fever or pain in the areas.  I explained that this would treat infection which is still a possibility, but less likely given his presentation.  Advised him to follow-up as needed for any acute new or worsening symptoms.  ED precautions discussed.   Final Clinical Impressions(s) / UC Diagnoses   Final diagnoses:  Swelling of left eyelid  Insect bite of other part of head, initial encounter     Discharge Instructions     Continue the medications that you are using (Benadryl and antihistamine eyedrops).  Add prednisone to this which will further help to decrease the swelling if this is due to allergy which we suspect.  Continue cool compresses.  If for some reason the condition is looking better and seems to be getting worse, you develop fever, you develop tenderness or pain of the areas, start the antibiotics and take full course as prescribed.  Anything is severely worse please return to our office or go to the emergency department.    ED Prescriptions    Medication Sig  Dispense Auth. Provider   predniSONE (DELTASONE) 10 MG tablet Take 5 tablets by mouth on the first day and decrease by 1 tablet for the next 4 days 15 tablet Eusebio Friendly B, PA-C   doxycycline (VIBRAMYCIN) 100 MG capsule Take 1 capsule (100 mg total) by mouth 2 (two) times daily for 7 days. 14 capsule Shirlee Latch, PA-C     PDMP not reviewed this encounter.   Shirlee Latch, PA-C 03/26/20 [phone]

## 2020-03-26 NOTE — ED Triage Notes (Signed)
Patient in today c/o left eye swelling x 3-4 days. Patient states it started with a bump on his left eye. Patient has taken OTC Benadryl, Opcon-A allergy relief eye drops and cool compresses with some relief.

## 2020-06-05 ENCOUNTER — Ambulatory Visit
Admission: EM | Admit: 2020-06-05 | Discharge: 2020-06-05 | Disposition: A | Discharge status: Home / Self Care | Payer: 59 | Attending: Physician Assistant | Admitting: Physician Assistant

## 2020-06-05 ENCOUNTER — Other Ambulatory Visit: Payer: Self-pay

## 2020-06-05 ENCOUNTER — Encounter: Payer: Self-pay | Admitting: Emergency Medicine

## 2020-06-05 DIAGNOSIS — L0201 Cutaneous abscess of face: Secondary | ICD-10-CM

## 2020-06-05 DIAGNOSIS — R220 Localized swelling, mass and lump, head: Secondary | ICD-10-CM

## 2020-06-05 MED ORDER — DOXYCYCLINE HYCLATE 100 MG PO CAPS: 100.0000 mg | ORAL_CAPSULE | Freq: Two times a day (BID) | ORAL | 0 refills | Status: AC

## 2020-06-05 MED ORDER — PREDNISONE 10 MG PO TABS: ORAL_TABLET | ORAL | 0 refills | Status: DC

## 2020-06-05 NOTE — ED Provider Notes (Signed)
[facility]    CSN: [id number] Arrival date & time: 06/05/20  0827      History   Chief Complaint Chief Complaint  Patient presents with  . Eye Problem    HPI Jonathan Peterson. is a 25 y.o. male presenting for acute swelling of the left maxillary area. Patient says it has been worsening since yesterday. He says he felt a "pimple" in the area and then noticed increased swelling. There area is tender and "hard." He denies fever. He was seen in the clinic 2 months ago by me for similar presentation and treated with doxycyline and prednisone. He says that he improved quickly with the medications. Denies history of MRSA.  Patient denies any eye pain, swelling, redness or drainage.  No visual changes.  No other complaints or concerns.  HPI  Past Medical History:  Diagnosis Date  . Acne   . Appendicitis   . Obesity   . Scoliosis     Patient Active Problem List   Diagnosis Date Noted  . Appendicitis 09/28/2017  . Acute appendicitis   . Chlamydia infection 12/25/2016  . Midline thoracic back pain 07/05/2014  . Spinal asymmetry (< 10 degrees) 07/05/2014    Past Surgical History:  Procedure Laterality Date  . APPENDECTOMY    . LAPAROSCOPIC APPENDECTOMY N/A 09/28/2017   Procedure: APPENDECTOMY LAPAROSCOPIC;  Surgeon: Jonathan Ro, MD;  Location: [facility] ORS;  Service: General;  Laterality: N/A;       Home Medications    Prior to Admission medications   Medication Sig Start Date End Date Taking? Authorizing Provider  doxycycline (VIBRAMYCIN) 100 MG capsule Take 1 capsule (100 mg total) by mouth 2 (two) times daily for 7 days. 06/05/20 06/12/20 Yes Jonathan Friendly B, PA-C  hydrOXYzine (ATARAX/VISTARIL) 25 MG tablet Take 1 tablet (25 mg total) by mouth every 8 (eight) hours as needed. 01/17/20   Jonathan Jansky, MD  predniSONE (DELTASONE) 10 MG tablet Take 5 tablets by mouth on the first day and decrease by 1 tablet for the next 4 days 06/05/20   Jonathan Peterson     Family History Family History  Problem Relation Age of Onset  . Allergic rhinitis Sister   . Anemia Mother   . Hypertension Mother   . Healthy Father     Social History Social History   Tobacco Use  . Smoking status: Current Every Day Smoker    Types: Cigars  . Smokeless tobacco: Never Used  . Tobacco comment: tobacco that he puts the mariijuana in  Vaping Use  . Vaping Use: Never used  Substance Use Topics  . Alcohol use: Yes    Comment: occasional  . Drug use: Yes    Types: Marijuana    Comment: 3-4 times daily     Allergies   Peanut oil   Review of Systems Review of Systems  Constitutional: Negative for fatigue and fever.  HENT: Positive for facial swelling. Negative for trouble swallowing and voice change.   Eyes: Negative for photophobia, pain, discharge, redness, itching and visual disturbance.  Respiratory: Negative for chest tightness and shortness of breath.   Musculoskeletal: Negative for myalgias.  Skin: Positive for color change. Negative for rash.  Neurological: Negative for weakness.  Hematological: Negative for adenopathy.     Physical Exam Triage Vital Signs ED Triage Vitals  Enc Vitals Group     BP 06/05/20 0853 124/66     Pulse Rate 06/05/20 0853 82  Resp 06/05/20 0853 18     Temp 06/05/20 0853 98.7 F (37.1 C)     Temp Source 06/05/20 0853 Oral     SpO2 06/05/20 0853 100 %     Weight 06/05/20 0852 175 lb (79.4 kg)     Height 06/05/20 0852 6\' 2"  (1.88 m)     Head Circumference --      Peak Flow --      Pain Score 06/05/20 0852 0     Pain Loc --      Pain Edu? --      Excl. in GC? --    No data found.  Updated Vital Signs BP 124/66 (BP Location: Left Arm)   Pulse 82   Temp 98.7 F (37.1 C) (Oral)   Resp 18   Ht 6\' 2"  (1.88 m)   Wt 175 lb (79.4 kg)   SpO2 100%   BMI 22.47 kg/m   Visual Acuity Right Eye Distance: 20/20 Left Eye Distance: 20/25 Bilateral Distance: 20/20  Right Eye Near:   Left Eye Near:     Bilateral Near:     Physical Exam Vitals and nursing note reviewed.  Constitutional:      General: He is not in acute distress.    Appearance: Normal appearance. He is well-developed and well-nourished. He is not ill-appearing or diaphoretic.  HENT:     Head: Normocephalic and atraumatic.     Comments: See photo below: There is an area of induration, erythema and tenderness over the right maxillary/zygomatic region.  No fluctuance.  No drainage or open wounds.    Nose: Nose normal.     Mouth/Throat:     Mouth: Mucous membranes are moist.     Pharynx: Oropharynx is clear.  Eyes:     General: No scleral icterus.    Conjunctiva/sclera: Conjunctivae normal.  Cardiovascular:     Rate and Rhythm: Normal rate and regular rhythm.     Heart sounds: Normal heart sounds.  Pulmonary:     Effort: Pulmonary effort is normal. No respiratory distress.     Breath sounds: Normal breath sounds.  Musculoskeletal:        General: No edema.     Cervical back: Neck supple.  Skin:    General: Skin is warm and dry.  Neurological:     General: No focal deficit present.     Mental Status: He is alert. Mental status is at baseline.     Motor: No weakness.     Gait: Gait normal.  Psychiatric:        Mood and Affect: Mood and affect and mood normal.        Behavior: Behavior normal.        Thought Content: Thought content normal.        UC Treatments / Results  Labs (all labs ordered are listed, but only abnormal results are displayed) Labs Reviewed - No data to display  EKG   Radiology No results found.  Procedures Procedures (including critical care time)  Medications Ordered in UC Medications - No data to display  Initial Impression / Assessment and Plan / UC Course  I have reviewed the triage vital signs and the nursing notes.  Pertinent labs & imaging results that were available during my care of the patient were reviewed by me and considered in my medical decision making (see  chart for details).   25 year old male returning to [facility] urgent care with similar complaints to his November 2021 visit.  I saw patient at his previous visit and took photographs at that time.  Compared that to today and the clinical presentation is similar to previous visit.  I suspect bacterial infection, I question MRSA.  Since he had improvement with doxycycline and prednisone previously, will repeat his medication regimen again.  Advised him to follow-up with dermatology as soon as he is able to.  At this time, advised taking the medications as prescribed, Tylenol for pain and applying heat to the region.  Return and ED precautions reviewed with patient.   Final Clinical Impressions(s) / UC Diagnoses   Final diagnoses:  Abscess of face  Facial swelling     Discharge Instructions     Your condition today is similar in appearance to when I saw you a couple of months ago.  Since you got better with the doxycycline and prednisone, we will try that again.  I suspect this is a bacterial infection.  I would advise following up with dermatology since this is the second infection in the past 2 months.  Monitor the area closely and if it seems to be getting worse, you develop fever, increased redness/swelling, you develop increased tenderness or pain of the area, please return to our office or go to the emergency department.    ED Prescriptions    Medication Sig Dispense Auth. Provider   predniSONE (DELTASONE) 10 MG tablet Take 5 tablets by mouth on the first day and decrease by 1 tablet for the next 4 days 15 tablet Jonathan Friendly B, PA-C   doxycycline (VIBRAMYCIN) 100 MG capsule Take 1 capsule (100 mg total) by mouth 2 (two) times daily for 7 days. 14 capsule Shirlee Latch, PA-C     PDMP not reviewed this encounter.   Shirlee Latch, PA-C 06/05/20 [phone]

## 2020-06-05 NOTE — Discharge Instructions (Signed)
Your condition today is similar in appearance to when I saw you a couple of months ago.  Since you got better with the doxycycline and prednisone, we will try that again.  I suspect this is a bacterial infection.  I would advise following up with dermatology since this is the second infection in the past 2 months.  Monitor the area closely and if it seems to be getting worse, you develop fever, increased redness/swelling, you develop increased tenderness or pain of the area, please return to our office or go to the emergency department.

## 2020-06-05 NOTE — ED Triage Notes (Signed)
Patient in today c/o right eye swelling x 1 day. Patient thought it was originally a pimple and applied toothpaste.

## 2021-08-15 ENCOUNTER — Ambulatory Visit: Payer: Self-pay

## 2021-08-16 ENCOUNTER — Ambulatory Visit
Admission: RE | Admit: 2021-08-16 | Discharge: 2021-08-16 | Disposition: A | Discharge status: Home / Self Care | Payer: 59 | Source: Ambulatory Visit | Attending: Emergency Medicine | Admitting: Emergency Medicine

## 2021-08-16 VITALS — BP 148/94 | HR 88 | Temp 98.6°F | Resp 18

## 2021-08-16 DIAGNOSIS — Z113 Encounter for screening for infections with a predominantly sexual mode of transmission: Secondary | ICD-10-CM | POA: Diagnosis not present

## 2021-08-16 NOTE — ED Triage Notes (Addendum)
Pt is unclear whether or not he was exposed to chlamydia so he just wants to make sure he gets checked for STIs. No sxs ?

## 2021-08-16 NOTE — Discharge Instructions (Addendum)
Your test will be back in a few days.  We have tested for gonorrhea, chlamydia, trichomonas.  Give [place] a working phone number so that we can contact you if needed. Refrain from sexual contact until all of your labs have come back, symptoms have resolved, and your partner(s) are treated if necessary.  ? ?Here is a list of primary care providers who are taking new patients: ? ?[facility] [place] ?Dr. Joseph Berkshire (sports medicine) ?Dr. Elizabeth Sauer ?[address] ?[address] ?[place] [address] ?[phone] ? ?[facility] ?[address], [place] [address] ?[phone] ? ?[facility] [place] ?[address] [place] [address]  ?[place] [address]  ?[phone] ? ?[facility] ?[address]  ?[place], [place] [address] ?[phone] ? ?[facility] ?[address]  ?[phone] ?[place], [place] [address] ? ?Here are clinics/ other resources who will see you if you do not have insurance. Some have certain criteria that you must meet. Call them and find out what they are: ? ?[facility]: ?[address] [place], [place] [address] ?Phone: [phone] ?Hours: First and Third Saturdays of each Month, 9 a.m. - 1 p.m. ? ?[facility]: ?[address], [place], [place] [address] ?Phone: [phone] ?Hours: ?Tuesday, 4 p.m. - 8 p.m. ?Thursday, 1 p.m. - 8 p.m. ?Wednesday, 9 a.m. - Noon ? ?[facility] ?[address] [place], [place] [address] ?Phone: [phone] ?Pharmacy Phone Number: [phone] ?Dental Phone Number: [phone] ?ACA Insurance Help: [phone] ? ?Dental Hours: ?Monday - Thursday, 8 a.m. - 6 p.m. ? ?[facility] ?[address] [place], [place] [address] ?Phone: [phone] ?Pharmacy Phone Number: [phone] ?ACA Insurance Help: [phone] ? ?[facility] ?[address] [place], [place] [address] ?Phone: [phone] ?Pharmacy Phone Number: [phone] ?ACA Insurance Help:  [phone] ? ?[facility] ?[address] [place], [place] [address] ?Phone: [phone] ?ACA Insurance Help: [phone]  ? ?[facility] ?[address], [place], [place] [address] ?Phone: [phone] ? ?Go to www.goodrx.com  or www.costplusdrugs.com to look up your medications. This will give you a list of where you can find your prescriptions at the most affordable prices. Or ask the pharmacist what the cash price is, or if they have any other discount programs available to help make your medication more affordable. This can be less expensive than what you would pay with insurance.   ?

## 2021-08-16 NOTE — ED Provider Notes (Signed)
HPI ? ?SUBJECTIVE: ? ?Kolbie D Bourdeau Montez Hageman. is a 26 y.o. male who presents for STD testing.  He states his male partner tested positive for "chlamydia bacteria," even though her STD testing was negative.  He has no other sexual partners.  He has no urinary complaints, penile rash, discharge, testicular pain or swelling, scrotal swelling, abdominal, back, pelvic pain.  He has a past medical history of chlamydia.  No history of gonorrhea, HIV, HSV, syphilis, trichomonas.  PCP: None ? ? ?Past Medical History:  ?Diagnosis Date  ? Acne   ? Appendicitis   ? Obesity   ? Scoliosis   ? ? ?Past Surgical History:  ?Procedure Laterality Date  ? APPENDECTOMY    ? LAPAROSCOPIC APPENDECTOMY N/A 09/28/2017  ? Procedure: APPENDECTOMY LAPAROSCOPIC;  Surgeon: Leafy Ro, MD;  Location: [facility] ORS;  Service: General;  Laterality: N/A;  ? ? ?Family History  ?Problem Relation Age of Onset  ? Allergic rhinitis Sister   ? Anemia Mother   ? Hypertension Mother   ? Healthy Father   ? ? ?Social History  ? ?Tobacco Use  ? Smoking status: Every Day  ?  Types: Cigars  ? Smokeless tobacco: Never  ? Tobacco comments:  ?  tobacco that he puts the mariijuana in  ?Vaping Use  ? Vaping Use: Never used  ?Substance Use Topics  ? Alcohol use: Yes  ?  Comment: occasional  ? Drug use: Yes  ?  Types: Marijuana  ?  Comment: 3-4 times daily  ? ? ?No current facility-administered medications for this encounter. ?No current outpatient medications on file. ? ?Allergies  ?Allergen Reactions  ? Peanut Oil Hives  ? ? ? ?ROS ? ?As noted in HPI.  ? ?Physical Exam ? ?BP (!) 148/94 (BP Location: Left Arm)   Pulse 88   Temp 98.6 ?F (37 ?C) (Oral)   Resp 18   SpO2 99%  ? ?Constitutional: Well developed, well nourished, no acute distress ?Eyes:  EOMI, conjunctiva normal bilaterally ?HENT: Normocephalic, atraumatic,mucus membranes moist ?Respiratory: Normal inspiratory effort ?Cardiovascular: Normal rate ?GI: nondistended soft.  No suprapubic tenderness ?GU: Normal  circumcised male, testes descended bilaterally.  no testicular or epididymal tenderness, swelling.  No scrotal erythema, edema.  No penile rash, discharge.  No inguinal lymphadenopathy.  Patient declined chaperone. ?skin: No rash, skin intact ?Musculoskeletal: no deformities ?Neurologic: Alert & oriented x 3, no focal neuro deficits ?Psychiatric: Speech and behavior appropriate ? ? ?ED Course ? ? ?Medications - No data to display ? ?No orders of the defined types were placed in this encounter. ? ? ?No results found for this or any previous visit (from the past 24 hour(s)). ?No results found. ? ?ED Clinical Impression ? ?1. Screening for STDs (sexually transmitted diseases)   ?  ? ?ED Assessment/Plan ? ?I obtained a swab for gonorrhea, chlamydia, trichomonas.  Will base treatment off of labs.  He declined HIV, syphilis test patient states that he is going to [place], but reassured him that we can call in a prescription of antibiotics across state lines.  He gave [place] a working phone number.  Advised him to refrain from intercourse until all of his labs have been resulted, his partner has been treated if necessary. ? ? ?Discussed MDM, treatment plan, and plan for follow-up with patient.  patient agrees with plan.  ? ?No orders of the defined types were placed in this encounter. ? ? ? ? ?*This clinic note was created using Scientist, clinical (histocompatibility and immunogenetics). Therefore,  there may be occasional mistakes despite careful proofreading. ? ?? ? ?  ?Domenick Gong, MD ?08/16/21 1357 ? ?

## 2021-08-17 ENCOUNTER — Telehealth: Payer: Self-pay

## 2021-08-17 NOTE — Telephone Encounter (Signed)
[facility] lab technician called to clarify source of lab collected from patient yesterday.  ?

## 2021-08-18 LAB — CYTOLOGY, (ORAL, ANAL, URETHRAL) ANCILLARY ONLY
Chlamydia: POSITIVE — AB
Comment: NEGATIVE
Comment: NEGATIVE
Comment: NORMAL
Neisseria Gonorrhea: NEGATIVE
Trichomonas: NEGATIVE

## 2021-08-21 ENCOUNTER — Telehealth (HOSPITAL_COMMUNITY): Payer: Self-pay | Admitting: Emergency Medicine

## 2021-08-21 MED ORDER — DOXYCYCLINE HYCLATE 100 MG PO CAPS: 100.0000 mg | ORAL_CAPSULE | Freq: Two times a day (BID) | ORAL | 0 refills | Status: AC

## 2021-12-06 ENCOUNTER — Other Ambulatory Visit (HOSPITAL_COMMUNITY)
Admission: RE | Admit: 2021-12-06 | Discharge: 2021-12-06 | Disposition: A | Discharge status: Home / Self Care | Payer: 59 | Source: Ambulatory Visit | Attending: Nurse Practitioner | Admitting: Nurse Practitioner

## 2021-12-06 ENCOUNTER — Ambulatory Visit (INDEPENDENT_AMBULATORY_CARE_PROVIDER_SITE_OTHER): Payer: Self-pay | Admitting: Nurse Practitioner

## 2021-12-06 ENCOUNTER — Encounter: Payer: Self-pay | Admitting: Nurse Practitioner

## 2021-12-06 VITALS — BP 116/62 | HR 85 | Temp 98.5°F | Resp 16 | Ht 72.75 in | Wt 183.6 lb

## 2021-12-06 DIAGNOSIS — F3289 Other specified depressive episodes: Secondary | ICD-10-CM

## 2021-12-06 DIAGNOSIS — Z113 Encounter for screening for infections with a predominantly sexual mode of transmission: Secondary | ICD-10-CM | POA: Diagnosis present

## 2021-12-06 DIAGNOSIS — Z23 Encounter for immunization: Secondary | ICD-10-CM

## 2021-12-06 DIAGNOSIS — R5383 Other fatigue: Secondary | ICD-10-CM

## 2021-12-06 DIAGNOSIS — Z1159 Encounter for screening for other viral diseases: Secondary | ICD-10-CM

## 2021-12-06 DIAGNOSIS — Z114 Encounter for screening for human immunodeficiency virus [HIV]: Secondary | ICD-10-CM

## 2021-12-06 DIAGNOSIS — Z13220 Encounter for screening for lipoid disorders: Secondary | ICD-10-CM

## 2021-12-06 DIAGNOSIS — Z131 Encounter for screening for diabetes mellitus: Secondary | ICD-10-CM

## 2021-12-06 DIAGNOSIS — Z7689 Persons encountering health services in other specified circumstances: Secondary | ICD-10-CM

## 2021-12-06 LAB — CBC WITH DIFFERENTIAL/PLATELET
Absolute Monocytes: 421 cells/uL (ref 200–950)
Basophils Relative: 2.1 %
Eosinophils Absolute: 199 cells/uL (ref 15–500)
Hemoglobin: 15.8 g/dL (ref 13.2–17.1)
Lymphs Abs: 1646 cells/uL (ref 850–3900)
MCH: 28.8 pg (ref 27.0–33.0)
MCHC: 32.6 g/dL (ref 32.0–36.0)
MCV: 88.5 fL (ref 80.0–100.0)
MPV: 9.5 fL (ref 7.5–12.5)
Neutro Abs: 1552 cells/uL (ref 1500–7800)
Neutrophils Relative %: 39.8 %
RBC: 5.48 10*6/uL (ref 4.20–5.80)
RDW: 13.1 % (ref 11.0–15.0)
Total Lymphocyte: 42.2 %
WBC: 3.9 10*3/uL (ref 3.8–10.8)

## 2021-12-06 NOTE — Progress Notes (Signed)
BP 116/62   Pulse 85   Temp 98.5 F (36.9 C)   Resp 16   Ht 6' 0.75" (1.848 m)   Wt 183 lb 9.6 oz (83.3 kg)   SpO2 100%   BMI 24.39 kg/m    Subjective:    Patient ID: Jonathan Gacek., male    DOB: [birth year], 26 y.o.   MRN: [id number]  HPI: Jonathan Heavin. is a 26 y.o. male  Chief Complaint  Patient presents with   Establish Care   Establish care: Last physical was a few years ago. He is due for a tetanus shot. Surgical history includes appendectomy.  Family history: diabetes, iron deficiency.   Fatigue: He says that he has been feeling fatigue for a while now. Patient states he first thought it had something to do with not working out as much. He says that he sleeps pretty good.  He says he does snore but not all the time. Discussed will get labs.   Depression:  He says that he does have some feelings about not being as accomplished as he hoped he would be.  He denies any suicidal thoughts.  Will work on increasing physical activity.     12/06/2021   10:36 AM 09/17/2016   11:08 AM  Depression screen PHQ 2/9  Decreased Interest 1 0  Down, Depressed, Hopeless 1 0  PHQ - 2 Score 2 0  Altered sleeping 0   Tired, decreased energy 1   Change in appetite 1   Feeling bad or failure about yourself  1   Trouble concentrating 1   Moving slowly or fidgety/restless 0   Suicidal thoughts 0   PHQ-9 Score 6   Difficult doing work/chores Not difficult at all      Relevant past medical, surgical, family and social history reviewed and updated as indicated. Interim medical history since our last visit reviewed. Allergies and medications reviewed and updated.  Review of Systems  Constitutional: Negative for fever or weight change. Positive for fatigue Respiratory: Negative for cough and shortness of breath.   Cardiovascular: Negative for chest pain or palpitations.  Gastrointestinal: Negative for abdominal pain, no bowel changes.  Musculoskeletal: Negative for gait problem  or joint swelling.  Skin: Negative for rash.  Neurological: Negative for dizziness or headache.  No other specific complaints in a complete review of systems (except as listed in HPI above).      Objective:    BP 116/62   Pulse 85   Temp 98.5 F (36.9 C)   Resp 16   Ht 6' 0.75" (1.848 m)   Wt 183 lb 9.6 oz (83.3 kg)   SpO2 100%   BMI 24.39 kg/m   Wt Readings from Last 3 Encounters:  12/06/21 183 lb 9.6 oz (83.3 kg)  06/05/20 175 lb (79.4 kg)  03/26/20 175 lb (79.4 kg)    Physical Exam  Constitutional: Patient appears well-developed and well-nourished.  No distress.  HEENT: head atraumatic, normocephalic, pupils equal and reactive to light,  neck supple Cardiovascular: Normal rate, regular rhythm and normal heart sounds.  No murmur heard. No BLE edema. Pulmonary/Chest: Effort normal and breath sounds normal. No respiratory distress. Abdominal: Soft.  There is no tenderness. Psychiatric: Patient has a normal mood and affect. behavior is normal. Judgment and thought content normal.  Results for orders placed or performed during the hospital encounter of 08/16/21  Cytology (oral, anal, urethral) ancillary only  Result Value Ref Range   Neisseria Gonorrhea Negative  Chlamydia Positive (A)    Trichomonas Negative    Comment Normal Reference Ranger Chlamydia - Negative    Comment      Normal Reference Range Neisseria Gonorrhea - Negative   Comment Normal Reference Range Trichomonas - Negative       Assessment & Plan:   Problem List Items Addressed This Visit   None Visit Diagnoses     Other fatigue    -  Primary   getting labs, encouraged to include physical activity in his daily life.    Relevant Orders   CBC with Differential/Platelet   COMPLETE METABOLIC PANEL WITH GFR   TSH   VITAMIN D 25 Hydroxy (Vit-D Deficiency, Fractures)   Vitamin B12   Hemoglobin A1c   HIV Antibody (routine testing w rflx)   Other depression       discussed increasing physical activity     Encounter to establish care       schedule cpe   Relevant Orders   CBC with Differential/Platelet   COMPLETE METABOLIC PANEL WITH GFR   TSH   VITAMIN D 25 Hydroxy (Vit-D Deficiency, Fractures)   Vitamin B12   Hemoglobin A1c   Hepatitis C antibody   Lipid panel   HIV Antibody (routine testing w rflx)   Screening for cholesterol level       Relevant Orders   Lipid panel   Screening for diabetes mellitus       Relevant Orders   Hemoglobin A1c   Encounter for hepatitis C screening test for low risk patient       Relevant Orders   Hepatitis C antibody   Screening for HIV without presence of risk factors       Relevant Orders   HIV Antibody (routine testing w rflx)   Screening for STDs (sexually transmitted diseases)       Relevant Orders   Urine cytology ancillary only   Need for tetanus, diphtheria, and acellular pertussis (Tdap) vaccine in patient of adolescent age or older       Relevant Orders   Tdap vaccine greater than or equal to 7yo IM        Follow up plan: Return in about 6 months (around 06/08/2022) for cpe.

## 2021-12-07 ENCOUNTER — Other Ambulatory Visit: Payer: Self-pay | Admitting: Nurse Practitioner

## 2021-12-07 DIAGNOSIS — E559 Vitamin D deficiency, unspecified: Secondary | ICD-10-CM

## 2021-12-07 LAB — COMPLETE METABOLIC PANEL WITH GFR
AG Ratio: 1.9 (calc) (ref 1.0–2.5)
ALT: 12 U/L (ref 9–46)
AST: 14 U/L (ref 10–40)
Albumin: 5.1 g/dL (ref 3.6–5.1)
Alkaline phosphatase (APISO): 64 U/L (ref 36–130)
BUN: 15 mg/dL (ref 7–25)
CO2: 29 mmol/L (ref 20–32)
Calcium: 10.1 mg/dL (ref 8.6–10.3)
Chloride: 102 mmol/L (ref 98–110)
Creat: 1.21 mg/dL (ref 0.60–1.24)
Globulin: 2.7 g/dL (calc) (ref 1.9–3.7)
Glucose, Bld: 88 mg/dL (ref 65–99)
Potassium: 4.9 mmol/L (ref 3.5–5.3)
Sodium: 140 mmol/L (ref 135–146)
Total Bilirubin: 0.7 mg/dL (ref 0.2–1.2)
Total Protein: 7.8 g/dL (ref 6.1–8.1)
eGFR: 86 mL/min/{1.73_m2} (ref >=60)

## 2021-12-07 LAB — LIPID PANEL
Cholesterol: 151 mg/dL (ref <200)
HDL: 78 mg/dL (ref >=40)
LDL Cholesterol (Calc): 60 mg/dL (calc)
Non-HDL Cholesterol (Calc): 73 mg/dL (calc) (ref <130)
Total CHOL/HDL Ratio: 1.9 (calc) (ref <5.0)
Triglycerides: 47 mg/dL (ref <150)

## 2021-12-07 LAB — TSH: TSH: 2.33 mIU/L (ref 0.40–4.50)

## 2021-12-07 LAB — HEPATITIS C ANTIBODY: Hepatitis C Ab: NONREACTIVE

## 2021-12-07 LAB — URINE CYTOLOGY ANCILLARY ONLY
Chlamydia: NEGATIVE
Comment: NEGATIVE
Comment: NORMAL
Neisseria Gonorrhea: NEGATIVE

## 2021-12-07 LAB — CBC WITH DIFFERENTIAL/PLATELET
Basophils Absolute: 82 cells/uL (ref 0–200)
Eosinophils Relative: 5.1 %
HCT: 48.5 % (ref 38.5–50.0)
Monocytes Relative: 10.8 %
Platelets: 291 10*3/uL (ref 140–400)

## 2021-12-07 LAB — VITAMIN D 25 HYDROXY (VIT D DEFICIENCY, FRACTURES): Vit D, 25-Hydroxy: 13 ng/mL — ABNORMAL LOW (ref 30–100)

## 2021-12-07 LAB — HEMOGLOBIN A1C
Hgb A1c MFr Bld: 5.2 % of total Hgb (ref <5.7)
Mean Plasma Glucose: 103 mg/dL
eAG (mmol/L): 5.7 mmol/L

## 2021-12-07 LAB — HIV ANTIBODY (ROUTINE TESTING W REFLEX): HIV 1&2 Ab, 4th Generation: NONREACTIVE

## 2021-12-07 LAB — VITAMIN B12: Vitamin B-12: 593 pg/mL (ref 200–1100)

## 2021-12-07 MED ORDER — VITAMIN D (ERGOCALCIFEROL) 1.25 MG (50000 UNIT) PO CAPS: 50000.0000 [IU] | ORAL_CAPSULE | ORAL | 0 refills | Status: AC

## 2022-05-08 ENCOUNTER — Ambulatory Visit (INDEPENDENT_AMBULATORY_CARE_PROVIDER_SITE_OTHER): Payer: No Typology Code available for payment source | Admitting: Nurse Practitioner

## 2022-05-08 ENCOUNTER — Other Ambulatory Visit: Payer: Self-pay

## 2022-05-08 ENCOUNTER — Encounter: Payer: Self-pay | Admitting: Nurse Practitioner

## 2022-05-08 VITALS — BP 110/72 | HR 100 | Temp 98.7°F | Resp 18 | Wt 182.4 lb

## 2022-05-08 DIAGNOSIS — R110 Nausea: Secondary | ICD-10-CM

## 2022-05-08 DIAGNOSIS — R519 Headache, unspecified: Secondary | ICD-10-CM | POA: Diagnosis not present

## 2022-05-08 MED ORDER — ONDANSETRON 4 MG PO TBDP: 4.0000 mg | ORAL_TABLET | Freq: Three times a day (TID) | ORAL | 0 refills | Status: AC | PRN

## 2022-05-08 MED ORDER — IBUPROFEN 800 MG PO TABS: 800.0000 mg | ORAL_TABLET | Freq: Three times a day (TID) | ORAL | 0 refills | Status: AC | PRN

## 2022-05-08 NOTE — Progress Notes (Signed)
BP 110/72   Pulse 100   Temp 98.7 F (37.1 C) (Oral)   Resp 18   Wt 182 lb 6.4 oz (82.7 kg)   SpO2 98%   BMI 24.23 kg/m    Subjective:    Patient ID: Jonathan Weekly., male    DOB: 09/14/1995, 26 y.o.   MRN: [id number]  HPI: Jonathan Halbert. is a 26 y.o. male  Chief Complaint  Patient presents with   Headache    Ongoing for 4 days. Since starting amoxcillin after hand injury   Headache: patient reports he has had a headache off and on for four days.He says it is typically on the right side of his head towards the front. He says he does not currently have a headache. He says he has taken tylenol, tylenol sinus and BC powder to help.  He says that he was put on on Augmentin for a dog bite and that when he noticed the headache. Patient denies any recent illness, fever. He denies any changes in mentation, vomiting, stiff neck, or numbness or tingling.  He says he has had some nausea as well.  Discussed that those symptoms could be side effects of the Augmentin.  Recommend pushing fluids and eating when taking antibiotic. Will send in prescription for ibuprofen and zofran.     Relevant past medical, surgical, family and social history reviewed and updated as indicated. Interim medical history since our last visit reviewed. Allergies and medications reviewed and updated.  Review of Systems  Constitutional: Negative for fever or weight change.  Respiratory: Negative for cough and shortness of breath.   Cardiovascular: Negative for chest pain or palpitations.  Gastrointestinal: Negative for abdominal pain, no bowel changes.  Musculoskeletal: Negative for gait problem or joint swelling.  Skin: Negative for rash.  Neurological: Negative for dizziness, positive for headache.  No other specific complaints in a complete review of systems (except as listed in HPI above).      Objective:    BP 110/72   Pulse 100   Temp 98.7 F (37.1 C) (Oral)   Resp 18   Wt 182 lb 6.4 oz (82.7  kg)   SpO2 98%   BMI 24.23 kg/m   Wt Readings from Last 3 Encounters:  05/08/22 182 lb 6.4 oz (82.7 kg)  12/06/21 183 lb 9.6 oz (83.3 kg)  06/05/20 175 lb (79.4 kg)    Physical Exam  Constitutional: Patient appears well-developed and well-nourished.  No distress.  HEENT: head atraumatic, normocephalic, pupils equal and reactive to light, ears TMs clear, neck supple, throat within normal limits Cardiovascular: Normal rate, regular rhythm and normal heart sounds.  No murmur heard. No BLE edema. Pulmonary/Chest: Effort normal and breath sounds normal. No respiratory distress. Abdominal: Soft.  There is no tenderness. Psychiatric: Patient has a normal mood and affect. behavior is normal. Judgment and thought content normal.  Results for orders placed or performed in visit on 12/06/21  CBC with Differential/Platelet  Result Value Ref Range   WBC 3.9 3.8 - 10.8 Thousand/uL   RBC 5.48 4.20 - 5.80 Million/uL   Hemoglobin 15.8 13.2 - 17.1 g/dL   HCT 48.5 38.5 - 50.0 %   MCV 88.5 80.0 - 100.0 fL   MCH 28.8 27.0 - 33.0 pg   MCHC 32.6 32.0 - 36.0 g/dL   RDW 13.1 11.0 - 15.0 %   Platelets 291 140 - 400 Thousand/uL   MPV 9.5 7.5 - 12.5 fL   Neutro Abs  1,552 1,500 - 7,800 cells/uL   Lymphs Abs 1,646 850 - 3,900 cells/uL   Absolute Monocytes 421 200 - 950 cells/uL   Eosinophils Absolute 199 15 - 500 cells/uL   Basophils Absolute 82 0 - 200 cells/uL   Neutrophils Relative % 39.8 %   Total Lymphocyte 42.2 %   Monocytes Relative 10.8 %   Eosinophils Relative 5.1 %   Basophils Relative 2.1 %  COMPLETE METABOLIC PANEL WITH GFR  Result Value Ref Range   Glucose, Bld 88 65 - 99 mg/dL   BUN 15 7 - 25 mg/dL   Creat 1.21 0.60 - 1.24 mg/dL   eGFR 86 > OR = 60 mL/min/1.16m   BUN/Creatinine Ratio NOT APPLICABLE 6 - 22 (calc)   Sodium 140 135 - 146 mmol/L   Potassium 4.9 3.5 - 5.3 mmol/L   Chloride 102 98 - 110 mmol/L   CO2 29 20 - 32 mmol/L   Calcium 10.1 8.6 - 10.3 mg/dL   Total Protein 7.8  6.1 - 8.1 g/dL   Albumin 5.1 3.6 - 5.1 g/dL   Globulin 2.7 1.9 - 3.7 g/dL (calc)   AG Ratio 1.9 1.0 - 2.5 (calc)   Total Bilirubin 0.7 0.2 - 1.2 mg/dL   Alkaline phosphatase (APISO) 64 36 - 130 U/L   AST 14 10 - 40 U/L   ALT 12 9 - 46 U/L  TSH  Result Value Ref Range   TSH 2.33 0.40 - 4.50 mIU/L  VITAMIN D 25 Hydroxy (Vit-D Deficiency, Fractures)  Result Value Ref Range   Vit D, 25-Hydroxy 13 (L) 30 - 100 ng/mL  Vitamin B12  Result Value Ref Range   Vitamin B-12 593 200 - 1,100 pg/mL  Hemoglobin A1c  Result Value Ref Range   Hgb A1c MFr Bld 5.2 <5.7 % of total Hgb   Mean Plasma Glucose 103 mg/dL   eAG (mmol/L) 5.7 mmol/L  Hepatitis C antibody  Result Value Ref Range   Hepatitis C Ab NON-REACTIVE NON-REACTIVE  Lipid panel  Result Value Ref Range   Cholesterol 151 <200 mg/dL   HDL 78 > OR = 40 mg/dL   Triglycerides 47 <150 mg/dL   LDL Cholesterol (Calc) 60 mg/dL (calc)   Total CHOL/HDL Ratio 1.9 <5.0 (calc)   Non-HDL Cholesterol (Calc) 73 <130 mg/dL (calc)  HIV Antibody (routine testing w rflx)  Result Value Ref Range   HIV 1&2 Ab, 4th Generation NON-REACTIVE NON-REACTIVE  Urine cytology ancillary only  Result Value Ref Range   Neisseria Gonorrhea Negative    Chlamydia Negative    Comment Normal Reference Ranger Chlamydia - Negative    Comment      Normal Reference Range Neisseria Gonorrhea - Negative      Assessment & Plan:   Problem List Items Addressed This Visit   None Visit Diagnoses     Nonintractable episodic headache, unspecified headache type    -  Primary   start ibuprofen and zofran, push fluids, eat when taking antibiotic, stick to a bland diet   Relevant Medications   traMADol (ULTRAM) 50 MG tablet   ibuprofen (ADVIL) 800 MG tablet   ondansetron (ZOFRAN-ODT) 4 MG disintegrating tablet   Nausea       start ibuprofen and zofran, push fluids, eat when taking antibiotic, stick to a bland diet   Relevant Medications   ondansetron (ZOFRAN-ODT) 4 MG  disintegrating tablet        Follow up plan: Return if symptoms worsen or fail to improve.

## 2022-06-07 ENCOUNTER — Other Ambulatory Visit: Payer: Self-pay

## 2022-06-07 ENCOUNTER — Telehealth (INDEPENDENT_AMBULATORY_CARE_PROVIDER_SITE_OTHER): Payer: 59 | Admitting: Nurse Practitioner

## 2022-06-07 ENCOUNTER — Encounter: Payer: Self-pay | Admitting: Nurse Practitioner

## 2022-06-07 DIAGNOSIS — R6889 Other general symptoms and signs: Secondary | ICD-10-CM | POA: Diagnosis not present

## 2022-06-07 DIAGNOSIS — Z20828 Contact with and (suspected) exposure to other viral communicable diseases: Secondary | ICD-10-CM | POA: Diagnosis not present

## 2022-06-07 MED ORDER — OSELTAMIVIR PHOSPHATE 75 MG PO CAPS: 75.0000 mg | ORAL_CAPSULE | Freq: Two times a day (BID) | ORAL | 0 refills | Status: AC

## 2022-06-07 MED ORDER — BENZONATATE 100 MG PO CAPS: 200.0000 mg | ORAL_CAPSULE | Freq: Two times a day (BID) | ORAL | 0 refills | Status: AC | PRN

## 2022-06-07 NOTE — Progress Notes (Signed)
Name: Jonathan Peterson.   MRN: [id number]    DOB: 04/19/1996   Date:06/07/2022       Progress Note  Subjective  Chief Complaint  Chief Complaint  Patient presents with   Influenza    Congested, dizzy, chills, sister tested positive for flu    I connected with  Kyjuan Gause.  on 06/07/22 at 8:00am by a video enabled telemedicine application and verified that I am speaking with the correct person using two identifiers.  I discussed the limitations of evaluation and management by telemedicine and the availability of in person appointments. The patient expressed understanding and agreed to proceed with a virtual visit  Staff also discussed with the patient that there may be a patient responsible charge related to this service. Patient Location: home Provider Location: [facility] Additional Individuals present: mom  HPI  Flu: patient reports symptoms started two days ago.  He says symptoms include nasal congestion, dizziness, chills and cough. He denies any fever or shortness of breath. He reports his sister and brother recently tested positive for flu.  He has tried sudafed for the symptoms.  Will send in prescription for tamiflu and tessalon perls.   Recommend taking zyrtec, flonase, mucinex, vitamin d, vitamin c, and zinc. Push fluids and get rest.     Patient Active Problem List   Diagnosis Date Noted   Appendicitis 09/28/2017   Acute appendicitis    Chlamydia infection 12/25/2016   Midline thoracic back pain 07/05/2014   Spinal asymmetry (< 10 degrees) 07/05/2014    Social History   Tobacco Use   Smoking status: Every Day    Types: Cigars   Smokeless tobacco: Never   Tobacco comments:    tobacco that he puts the mariijuana in  Substance Use Topics   Alcohol use: Yes    Comment: special occasional     Current Outpatient Medications:    ibuprofen (ADVIL) 800 MG tablet, Take 1 tablet (800 mg total) by mouth every 8 (eight) hours as needed for headache. (Patient not  taking: Reported on 06/07/2022), Disp: 30 tablet, Rfl: 0   ondansetron (ZOFRAN-ODT) 4 MG disintegrating tablet, Take 1 tablet (4 mg total) by mouth every 8 (eight) hours as needed for nausea. (Patient not taking: Reported on 06/07/2022), Disp: 10 tablet, Rfl: 0   traMADol (ULTRAM) 50 MG tablet, Take 50 mg by mouth every 8 (eight) hours as needed. (Patient not taking: Reported on 06/07/2022), Disp: , Rfl:    Vitamin D, Ergocalciferol, (DRISDOL) 1.25 MG (50000 UNIT) CAPS capsule, Take 1 capsule (50,000 Units total) by mouth every 7 (seven) days. (Patient not taking: Reported on 06/07/2022), Disp: 5 capsule, Rfl: 0  Allergies  Allergen Reactions   Peanut Oil Hives    I personally reviewed active problem list, medication list, allergies with the patient/caregiver today.  ROS  Constitutional: Negative for fever or weight change.  Respiratory: Negative for cough and shortness of breath.   Cardiovascular: Negative for chest pain or palpitations.  Gastrointestinal: Negative for abdominal pain, no bowel changes.  Musculoskeletal: Negative for gait problem or joint swelling.  Skin: Negative for rash.  Neurological: Negative for dizziness or headache.  No other specific complaints in a complete review of systems (except as listed in HPI above).   Objective  Virtual encounter, vitals not obtained.  There is no height or weight on file to calculate BMI.  Nursing Note and Vital Signs reviewed.  Physical Exam  Awake, alert and oriented, speaking in complete  sentences  No results found for this or any previous visit (from the past 72 hour(s)).  Assessment & Plan  1. Flu-like symptoms Recommend taking zyrtec, flonase, mucinex, vitamin d, vitamin c, and zinc. Push fluids and get rest.    - oseltamivir (TAMIFLU) 75 MG capsule; Take 1 capsule (75 mg total) by mouth 2 (two) times daily for 5 days.  Dispense: 10 capsule; Refill: 0 - benzonatate (TESSALON) 100 MG capsule; Take 2 capsules (200 mg  total) by mouth 2 (two) times daily as needed for cough.  Dispense: 20 capsule; Refill: 0  2. Exposure to the flu Recommend taking zyrtec, flonase, mucinex, vitamin d, vitamin c, and zinc. Push fluids and get rest.    - oseltamivir (TAMIFLU) 75 MG capsule; Take 1 capsule (75 mg total) by mouth 2 (two) times daily for 5 days.  Dispense: 10 capsule; Refill: 0 - benzonatate (TESSALON) 100 MG capsule; Take 2 capsules (200 mg total) by mouth 2 (two) times daily as needed for cough.  Dispense: 20 capsule; Refill: 0   -Red flags and when to present for emergency care or RTC including fever >101.74F, chest pain, shortness of breath, new/worsening/un-resolving symptoms,  reviewed with patient at time of visit. Follow up and care instructions discussed and provided in AVS. - I discussed the assessment and treatment plan with the patient. The patient was provided an opportunity to ask questions and all were answered. The patient agreed with the plan and demonstrated an understanding of the instructions.  I provided 15 minutes of non-face-to-face time during this encounter.  Bo Merino, FNP

## 2022-06-08 ENCOUNTER — Encounter: Payer: Medicaid Other | Admitting: Nurse Practitioner

## 2023-02-21 ENCOUNTER — Encounter: Payer: Self-pay | Admitting: Emergency Medicine

## 2023-02-21 ENCOUNTER — Emergency Department
Admission: EM | Admit: 2023-02-21 | Discharge: 2023-02-21 | Disposition: A | Discharge status: Home / Self Care | Payer: 59 | Attending: Emergency Medicine | Admitting: Emergency Medicine

## 2023-02-21 ENCOUNTER — Other Ambulatory Visit: Payer: Self-pay

## 2023-02-21 DIAGNOSIS — Y92410 Unspecified street and highway as the place of occurrence of the external cause: Secondary | ICD-10-CM | POA: Insufficient documentation

## 2023-02-21 DIAGNOSIS — S161XXA Strain of muscle, fascia and tendon at neck level, initial encounter: Secondary | ICD-10-CM | POA: Diagnosis not present

## 2023-02-21 DIAGNOSIS — M546 Pain in thoracic spine: Secondary | ICD-10-CM | POA: Insufficient documentation

## 2023-02-21 DIAGNOSIS — S199XXA Unspecified injury of neck, initial encounter: Secondary | ICD-10-CM | POA: Diagnosis present

## 2023-02-21 MED ORDER — IBUPROFEN 600 MG PO TABS
600.0000 mg | ORAL_TABLET | Freq: Once | ORAL | Status: AC
  Administered 2023-02-21: 600 mg via ORAL
  Filled 2023-02-21: qty 1

## 2023-02-21 MED ORDER — MUSCLE RUB 10-15 % EX CREA: 1.0000 | TOPICAL_CREAM | CUTANEOUS | 0 refills | Status: AC | PRN

## 2023-02-21 MED ORDER — ACETAMINOPHEN 500 MG PO TABS
1000.0000 mg | ORAL_TABLET | Freq: Once | ORAL | Status: AC
  Administered 2023-02-21: 1000 mg via ORAL
  Filled 2023-02-21: qty 2

## 2023-02-21 MED ORDER — LIDOCAINE 5 % EX PTCH
1.0000 | MEDICATED_PATCH | CUTANEOUS | Status: DC
  Administered 2023-02-21: 1 via TRANSDERMAL
  Filled 2023-02-21: qty 1

## 2023-02-21 NOTE — Discharge Instructions (Addendum)
Take acetaminophen 650 mg and ibuprofen 400 mg every 6 hours for pain.  Take with food. Use muscle rub as prescribed.   Thank you for choosing [place] for your health care today!  Please see your primary doctor this week for a follow up appointment.   If you have any new, worsening, or unexpected symptoms call your doctor right away or come back to the emergency department for reevaluation.  It was my pleasure to care for you today.   Daneil Dan Modesto Charon, MD
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
## 2023-02-25 ENCOUNTER — Ambulatory Visit: Payer: Self-pay | Admitting: *Deleted
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
## 2023-02-26 ENCOUNTER — Ambulatory Visit: Payer: 59 | Admitting: Physician Assistant

## 2023-02-26 ENCOUNTER — Encounter: Payer: Self-pay | Admitting: Physician Assistant

## 2023-02-26 VITALS — BP 130/82 | HR 97 | Temp 97.5°F | Resp 16 | Ht 72.75 in | Wt 190.3 lb

## 2023-02-26 DIAGNOSIS — S134XXD Sprain of ligaments of cervical spine, subsequent encounter: Secondary | ICD-10-CM

## 2023-02-26 DIAGNOSIS — M545 Low back pain, unspecified: Secondary | ICD-10-CM | POA: Diagnosis not present

## 2023-02-26 MED ORDER — PREDNISONE 20 MG PO TABS
ORAL_TABLET | ORAL | 0 refills | Status: AC
Start: 2023-02-26 — End: ?
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
## 2023-02-26 NOTE — Patient Instructions (Addendum)
  I recommend the following at this time to help relieve that discomfort:  Rest Warm compresses to the area (20 minutes on, minimum of 30 minutes off) You can alternate Tylenol and Ibuprofen for pain management but Ibuprofen is typically preferred to reduce inflammation.  Gentle stretches and exercises that I have included in your paperwork You can try to use lidocaine patches and Voltaren gel to the areas that are hurting as well  Try to reduce excess strain to the area and rest as much as possible  Wear supportive shoes and, if you must lift anything, use proper lifting techniques that spare your back.   If these measures do not lead to improvement in your symptoms over the next 2-4 weeks please let us know    Please go here for your  xray   [address] [place] [address] [place], [place] [address]

## 2023-02-27 ENCOUNTER — Ambulatory Visit
Admission: RE | Admit: 2023-02-27 | Discharge: 2023-02-27 | Disposition: A | Discharge status: Home / Self Care | Payer: 59 | Source: Ambulatory Visit | Attending: Physician Assistant | Admitting: Physician Assistant

## 2023-02-27 ENCOUNTER — Ambulatory Visit
Admission: RE | Admit: 2023-02-27 | Discharge: 2023-02-27 | Disposition: A | Discharge status: Home / Self Care | Payer: 59 | Attending: Physician Assistant | Admitting: Physician Assistant

## 2023-02-27 DIAGNOSIS — Y92410 Unspecified street and highway as the place of occurrence of the external cause: Secondary | ICD-10-CM | POA: Insufficient documentation

## 2023-02-27 DIAGNOSIS — M48061 Spinal stenosis, lumbar region without neurogenic claudication: Secondary | ICD-10-CM | POA: Insufficient documentation

## 2023-02-28 NOTE — Progress Notes (Signed)
Your xrays did not show evidence of fracture or dislocation in the neck, thoracic or lumbar spines

## 2023-03-01 ENCOUNTER — Inpatient Hospital Stay: Payer: 59 | Admitting: Nurse Practitioner

## 2023-03-12 ENCOUNTER — Ambulatory Visit: Payer: Self-pay
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
## 2023-03-13 ENCOUNTER — Other Ambulatory Visit: Payer: Self-pay

## 2023-03-13 ENCOUNTER — Encounter: Payer: Self-pay | Admitting: Nurse Practitioner

## 2023-03-13 ENCOUNTER — Ambulatory Visit (INDEPENDENT_AMBULATORY_CARE_PROVIDER_SITE_OTHER): Payer: BLUE CROSS/BLUE SHIELD | Admitting: Nurse Practitioner

## 2023-03-13 VITALS — BP 110/72 | HR 100 | Temp 98.0°F | Resp 16 | Ht 72.75 in | Wt 192.1 lb

## 2023-03-13 DIAGNOSIS — M545 Low back pain, unspecified: Secondary | ICD-10-CM | POA: Diagnosis not present

## 2023-03-13 DIAGNOSIS — S134XXD Sprain of ligaments of cervical spine, subsequent encounter: Secondary | ICD-10-CM | POA: Diagnosis not present
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
## 2023-05-16 DIAGNOSIS — S161XXA Strain of muscle, fascia and tendon at neck level, initial encounter: Secondary | ICD-10-CM | POA: Diagnosis not present

## 2023-05-16 DIAGNOSIS — S39012D Strain of muscle, fascia and tendon of lower back, subsequent encounter: Secondary | ICD-10-CM | POA: Diagnosis not present

## 2023-05-29 DIAGNOSIS — S161XXA Strain of muscle, fascia and tendon at neck level, initial encounter: Secondary | ICD-10-CM | POA: Diagnosis not present

## 2023-05-29 DIAGNOSIS — S39012D Strain of muscle, fascia and tendon of lower back, subsequent encounter: Secondary | ICD-10-CM | POA: Diagnosis not present

## 2023-05-30 DIAGNOSIS — S161XXA Strain of muscle, fascia and tendon at neck level, initial encounter: Secondary | ICD-10-CM | POA: Diagnosis not present

## 2023-05-30 DIAGNOSIS — S39012D Strain of muscle, fascia and tendon of lower back, subsequent encounter: Secondary | ICD-10-CM | POA: Diagnosis not present

## 2023-06-03 DIAGNOSIS — S161XXA Strain of muscle, fascia and tendon at neck level, initial encounter: Secondary | ICD-10-CM | POA: Diagnosis not present

## 2023-06-03 DIAGNOSIS — S39012D Strain of muscle, fascia and tendon of lower back, subsequent encounter: Secondary | ICD-10-CM | POA: Diagnosis not present

## 2023-06-10 DIAGNOSIS — S39012D Strain of muscle, fascia and tendon of lower back, subsequent encounter: Secondary | ICD-10-CM | POA: Diagnosis not present

## 2023-06-10 DIAGNOSIS — S161XXA Strain of muscle, fascia and tendon at neck level, initial encounter: Secondary | ICD-10-CM | POA: Diagnosis not present

## 2023-06-12 DIAGNOSIS — S39012D Strain of muscle, fascia and tendon of lower back, subsequent encounter: Secondary | ICD-10-CM | POA: Diagnosis not present

## 2023-06-12 DIAGNOSIS — S161XXA Strain of muscle, fascia and tendon at neck level, initial encounter: Secondary | ICD-10-CM | POA: Diagnosis not present
